# Patient Record
Sex: Male | Born: 1970 | Race: White | Hispanic: No | Marital: Single | Smoking: Current every day smoker
Health system: Southern US, Community
[De-identification: ages and names within clinical notes are randomized; demographics above are authoritative.]

## PROBLEM LIST (undated history)

## (undated) DIAGNOSIS — F32A Depression, unspecified: Secondary | ICD-10-CM

## (undated) DIAGNOSIS — R519 Headache, unspecified: Secondary | ICD-10-CM

## (undated) DIAGNOSIS — I350 Nonrheumatic aortic (valve) stenosis: Secondary | ICD-10-CM

## (undated) DIAGNOSIS — R51 Headache: Secondary | ICD-10-CM

## (undated) DIAGNOSIS — F329 Major depressive disorder, single episode, unspecified: Secondary | ICD-10-CM

## (undated) DIAGNOSIS — F419 Anxiety disorder, unspecified: Secondary | ICD-10-CM

## (undated) HISTORY — PX: WISDOM TOOTH EXTRACTION: SHX21

## (undated) HISTORY — PX: CARDIAC CATHETERIZATION: SHX172

---

## 2017-09-30 ENCOUNTER — Inpatient Hospital Stay (HOSPITAL_COMMUNITY): Payer: Self-pay

## 2017-09-30 ENCOUNTER — Observation Stay (HOSPITAL_COMMUNITY): Payer: Self-pay

## 2017-09-30 ENCOUNTER — Inpatient Hospital Stay (HOSPITAL_COMMUNITY)
Admission: EM | Admit: 2017-09-30 | Discharge: 2017-10-03 | DRG: 917 | Disposition: A | Discharge status: Home / Self Care | Payer: Self-pay | Attending: Internal Medicine | Admitting: Internal Medicine

## 2017-09-30 ENCOUNTER — Emergency Department (HOSPITAL_COMMUNITY): Payer: Self-pay

## 2017-09-30 ENCOUNTER — Other Ambulatory Visit: Payer: Self-pay

## 2017-09-30 ENCOUNTER — Encounter (HOSPITAL_COMMUNITY): Payer: Self-pay | Admitting: Emergency Medicine

## 2017-09-30 DIAGNOSIS — T402X1A Poisoning by other opioids, accidental (unintentional), initial encounter: Principal | ICD-10-CM | POA: Diagnosis present

## 2017-09-30 DIAGNOSIS — I248 Other forms of acute ischemic heart disease: Secondary | ICD-10-CM

## 2017-09-30 DIAGNOSIS — T50901A Poisoning by unspecified drugs, medicaments and biological substances, accidental (unintentional), initial encounter: Secondary | ICD-10-CM | POA: Diagnosis present

## 2017-09-30 DIAGNOSIS — K029 Dental caries, unspecified: Secondary | ICD-10-CM

## 2017-09-30 DIAGNOSIS — R4020 Unspecified coma: Secondary | ICD-10-CM | POA: Diagnosis present

## 2017-09-30 DIAGNOSIS — E876 Hypokalemia: Secondary | ICD-10-CM | POA: Diagnosis present

## 2017-09-30 DIAGNOSIS — D72829 Elevated white blood cell count, unspecified: Secondary | ICD-10-CM

## 2017-09-30 DIAGNOSIS — R778 Other specified abnormalities of plasma proteins: Secondary | ICD-10-CM

## 2017-09-30 DIAGNOSIS — J449 Chronic obstructive pulmonary disease, unspecified: Secondary | ICD-10-CM | POA: Diagnosis present

## 2017-09-30 DIAGNOSIS — J9691 Respiratory failure, unspecified with hypoxia: Secondary | ICD-10-CM | POA: Diagnosis present

## 2017-09-30 DIAGNOSIS — F17210 Nicotine dependence, cigarettes, uncomplicated: Secondary | ICD-10-CM | POA: Diagnosis present

## 2017-09-30 DIAGNOSIS — I350 Nonrheumatic aortic (valve) stenosis: Secondary | ICD-10-CM

## 2017-09-30 DIAGNOSIS — G92 Toxic encephalopathy: Secondary | ICD-10-CM | POA: Diagnosis present

## 2017-09-30 DIAGNOSIS — E860 Dehydration: Secondary | ICD-10-CM | POA: Diagnosis present

## 2017-09-30 DIAGNOSIS — T407X1A Poisoning by cannabis (derivatives), accidental (unintentional), initial encounter: Secondary | ICD-10-CM | POA: Diagnosis present

## 2017-09-30 DIAGNOSIS — N179 Acute kidney failure, unspecified: Secondary | ICD-10-CM

## 2017-09-30 DIAGNOSIS — I214 Non-ST elevation (NSTEMI) myocardial infarction: Secondary | ICD-10-CM

## 2017-09-30 DIAGNOSIS — I361 Nonrheumatic tricuspid (valve) insufficiency: Secondary | ICD-10-CM

## 2017-09-30 DIAGNOSIS — I21A1 Myocardial infarction type 2: Secondary | ICD-10-CM | POA: Diagnosis present

## 2017-09-30 DIAGNOSIS — I959 Hypotension, unspecified: Secondary | ICD-10-CM

## 2017-09-30 DIAGNOSIS — R739 Hyperglycemia, unspecified: Secondary | ICD-10-CM | POA: Diagnosis present

## 2017-09-30 DIAGNOSIS — R7989 Other specified abnormal findings of blood chemistry: Secondary | ICD-10-CM

## 2017-09-30 DIAGNOSIS — T40601A Poisoning by unspecified narcotics, accidental (unintentional), initial encounter: Secondary | ICD-10-CM

## 2017-09-30 DIAGNOSIS — Z8249 Family history of ischemic heart disease and other diseases of the circulatory system: Secondary | ICD-10-CM

## 2017-09-30 LAB — CBC WITH DIFFERENTIAL/PLATELET
BASOS ABS: 0 10*3/uL (ref 0.0–0.1)
Basophils Relative: 0 %
EOS ABS: 0.3 10*3/uL (ref 0.0–0.7)
Eosinophils Relative: 1 %
HCT: 46.7 % (ref 39.0–52.0)
HEMOGLOBIN: 15.6 g/dL (ref 13.0–17.0)
LYMPHS PCT: 6 %
Lymphs Abs: 1.8 10*3/uL (ref 0.7–4.0)
MCH: 31.8 pg (ref 26.0–34.0)
MCHC: 33.4 g/dL (ref 30.0–36.0)
MCV: 95.3 fL (ref 78.0–100.0)
Monocytes Absolute: 0.9 10*3/uL (ref 0.1–1.0)
Monocytes Relative: 3 %
NEUTROS ABS: 26.7 10*3/uL — AB (ref 1.7–7.7)
Neutrophils Relative %: 90 %
Platelets: 391 10*3/uL (ref 150–400)
RBC: 4.9 MIL/uL (ref 4.22–5.81)
RDW: 13.4 % (ref 11.5–15.5)
WBC: 29.7 10*3/uL — ABNORMAL HIGH (ref 4.0–10.5)

## 2017-09-30 LAB — COMPREHENSIVE METABOLIC PANEL
ALBUMIN: 3.9 g/dL (ref 3.5–5.0)
ALK PHOS: 93 U/L (ref 38–126)
ALT: 16 U/L (ref 0–44)
ANION GAP: 13 (ref 5–15)
AST: 25 U/L (ref 15–41)
BUN: 8 mg/dL (ref 6–20)
CALCIUM: 8.9 mg/dL (ref 8.9–10.3)
CO2: 23 mmol/L (ref 22–32)
Chloride: 101 mmol/L (ref 98–111)
Creatinine, Ser: 1.56 mg/dL — ABNORMAL HIGH (ref 0.61–1.24)
GFR calc Af Amer: 59 mL/min — ABNORMAL LOW (ref >60)
GFR calc non Af Amer: 51 mL/min — ABNORMAL LOW (ref >60)
Glucose, Bld: 256 mg/dL — ABNORMAL HIGH (ref 70–99)
POTASSIUM: 3.3 mmol/L — AB (ref 3.5–5.1)
SODIUM: 137 mmol/L (ref 135–145)
TOTAL PROTEIN: 6.2 g/dL — AB (ref 6.5–8.1)
Total Bilirubin: 0.5 mg/dL (ref 0.3–1.2)

## 2017-09-30 LAB — CBC
HCT: 40.9 % (ref 39.0–52.0)
Hemoglobin: 13.3 g/dL (ref 13.0–17.0)
MCH: 32 pg (ref 26.0–34.0)
MCHC: 32.5 g/dL (ref 30.0–36.0)
MCV: 98.3 fL (ref 78.0–100.0)
PLATELETS: 277 10*3/uL (ref 150–400)
RBC: 4.16 MIL/uL — ABNORMAL LOW (ref 4.22–5.81)
RDW: 13.9 % (ref 11.5–15.5)
WBC: 16.6 10*3/uL — AB (ref 4.0–10.5)

## 2017-09-30 LAB — CREATININE, SERUM
Creatinine, Ser: 1.13 mg/dL (ref 0.61–1.24)
GFR calc Af Amer: >60 mL/min (ref >60)
GFR calc non Af Amer: >60 mL/min (ref >60)

## 2017-09-30 LAB — I-STAT TROPONIN, ED: Troponin i, poc: 1.68 ng/mL (ref 0.00–0.08)

## 2017-09-30 LAB — URINALYSIS, ROUTINE W REFLEX MICROSCOPIC
Bilirubin Urine: NEGATIVE
GLUCOSE, UA: 50 mg/dL — AB
HGB URINE DIPSTICK: NEGATIVE
Ketones, ur: NEGATIVE mg/dL
LEUKOCYTES UA: NEGATIVE
NITRITE: NEGATIVE
PROTEIN: 100 mg/dL — AB
SPECIFIC GRAVITY, URINE: 1.016 (ref 1.005–1.030)
pH: 7 (ref 5.0–8.0)

## 2017-09-30 LAB — CK: Total CK: 199 U/L (ref 49–397)

## 2017-09-30 LAB — RAPID URINE DRUG SCREEN, HOSP PERFORMED
AMPHETAMINES: NOT DETECTED
BENZODIAZEPINES: NOT DETECTED
Barbiturates: NOT DETECTED
Cocaine: NOT DETECTED
Opiates: NOT DETECTED
TETRAHYDROCANNABINOL: POSITIVE — AB

## 2017-09-30 LAB — HEMOGLOBIN A1C
HEMOGLOBIN A1C: 5.4 % (ref 4.8–5.6)
MEAN PLASMA GLUCOSE: 108.28 mg/dL

## 2017-09-30 LAB — LIPID PANEL
CHOL/HDL RATIO: 3.3 ratio
CHOLESTEROL: 98 mg/dL (ref 0–200)
HDL: 30 mg/dL — ABNORMAL LOW (ref >40)
LDL CALC: 54 mg/dL (ref 0–99)
Triglycerides: 68 mg/dL (ref <150)
VLDL: 14 mg/dL (ref 0–40)

## 2017-09-30 LAB — SODIUM, URINE, RANDOM: Sodium, Ur: 66 mmol/L

## 2017-09-30 LAB — CREATININE, URINE, RANDOM: Creatinine, Urine: 16.65 mg/dL

## 2017-09-30 LAB — PROCALCITONIN: Procalcitonin: 23.12 ng/mL

## 2017-09-30 LAB — TROPONIN I
Troponin I: 1.56 ng/mL (ref <0.03)
Troponin I: 2.96 ng/mL (ref <0.03)
Troponin I: 1.98 ng/mL (ref <0.03)
Troponin I: 2.05 ng/mL (ref <0.03)

## 2017-09-30 LAB — ECHOCARDIOGRAM COMPLETE
HEIGHTINCHES: 70 in
Weight: 2080 oz

## 2017-09-30 LAB — ETHANOL

## 2017-09-30 LAB — PHOSPHORUS: Phosphorus: 3.5 mg/dL (ref 2.5–4.6)

## 2017-09-30 LAB — I-STAT CG4 LACTIC ACID, ED: LACTIC ACID, VENOUS: 1.61 mmol/L (ref 0.5–1.9)

## 2017-09-30 LAB — MAGNESIUM: Magnesium: 1.5 mg/dL — ABNORMAL LOW (ref 1.7–2.4)

## 2017-09-30 LAB — PROTEIN, URINE, RANDOM: Total Protein, Urine: <6 mg/dL

## 2017-09-30 MED ORDER — VANCOMYCIN HCL 500 MG IV SOLR
500.0000 mg | Freq: Two times a day (BID) | INTRAVENOUS | Status: DC
  Filled 2017-09-30 (×4): qty 500

## 2017-09-30 MED ORDER — THIAMINE HCL 100 MG/ML IJ SOLN
100.0000 mg | Freq: Every day | INTRAMUSCULAR | Status: DC
  Administered 2017-10-01: 100 mg via INTRAVENOUS
  Filled 2017-09-30 (×2): qty 2

## 2017-09-30 MED ORDER — SODIUM CHLORIDE 0.9 % IV BOLUS (SEPSIS)
2000.0000 mL | Freq: Once | INTRAVENOUS | Status: AC
  Administered 2017-09-30: 2000 mL via INTRAVENOUS

## 2017-09-30 MED ORDER — LORAZEPAM 2 MG/ML IJ SOLN: 0.0000 mg | Freq: Two times a day (BID) | INTRAMUSCULAR | Status: DC

## 2017-09-30 MED ORDER — NICOTINE 7 MG/24HR TD PT24
7.0000 mg | MEDICATED_PATCH | Freq: Every day | TRANSDERMAL | Status: DC
  Filled 2017-09-30: qty 1

## 2017-09-30 MED ORDER — LORAZEPAM 2 MG/ML IJ SOLN: 0.0000 mg | Freq: Four times a day (QID) | INTRAMUSCULAR | Status: AC

## 2017-09-30 MED ORDER — LORAZEPAM 2 MG/ML IJ SOLN: 1.0000 mg | Freq: Four times a day (QID) | INTRAMUSCULAR | Status: AC | PRN

## 2017-09-30 MED ORDER — SODIUM CHLORIDE 0.9 % IV BOLUS (SEPSIS)
1000.0000 mL | Freq: Once | INTRAVENOUS | Status: AC
  Administered 2017-09-30: 1000 mL via INTRAVENOUS

## 2017-09-30 MED ORDER — SODIUM CHLORIDE 0.9 % IV SOLN
INTRAVENOUS | Status: DC
  Administered 2017-09-30 – 2017-10-01 (×3): via INTRAVENOUS

## 2017-09-30 MED ORDER — VANCOMYCIN HCL 500 MG IV SOLR
500.0000 mg | Freq: Two times a day (BID) | INTRAVENOUS | Status: DC
  Filled 2017-09-30 (×2): qty 500

## 2017-09-30 MED ORDER — VANCOMYCIN HCL IN DEXTROSE 1-5 GM/200ML-% IV SOLN
1000.0000 mg | Freq: Once | INTRAVENOUS | Status: AC
  Administered 2017-09-30: 1000 mg via INTRAVENOUS
  Filled 2017-09-30: qty 200

## 2017-09-30 MED ORDER — SODIUM CHLORIDE 0.9 % IV SOLN
1.0000 g | Freq: Three times a day (TID) | INTRAVENOUS | Status: DC
  Administered 2017-09-30 – 2017-10-01 (×2): 1 g via INTRAVENOUS
  Filled 2017-09-30 (×3): qty 1

## 2017-09-30 MED ORDER — ADULT MULTIVITAMIN W/MINERALS CH
1.0000 | ORAL_TABLET | Freq: Every day | ORAL | Status: DC
  Administered 2017-09-30 – 2017-10-03 (×4): 1 via ORAL
  Filled 2017-09-30 (×4): qty 1

## 2017-09-30 MED ORDER — SODIUM CHLORIDE 0.9 % IV SOLN
1.0000 g | INTRAVENOUS | Status: DC
  Filled 2017-09-30: qty 1

## 2017-09-30 MED ORDER — METRONIDAZOLE IN NACL 5-0.79 MG/ML-% IV SOLN
500.0000 mg | Freq: Three times a day (TID) | INTRAVENOUS | Status: DC
  Administered 2017-09-30 – 2017-10-01 (×4): 500 mg via INTRAVENOUS
  Filled 2017-09-30 (×5): qty 100

## 2017-09-30 MED ORDER — SODIUM CHLORIDE 0.9 % IV SOLN
2.0000 g | Freq: Once | INTRAVENOUS | Status: AC
  Administered 2017-09-30: 2 g via INTRAVENOUS
  Filled 2017-09-30: qty 2

## 2017-09-30 MED ORDER — ASPIRIN EC 81 MG PO TBEC
81.0000 mg | DELAYED_RELEASE_TABLET | Freq: Every day | ORAL | Status: DC
  Administered 2017-09-30 – 2017-10-01 (×2): 81 mg via ORAL
  Filled 2017-09-30 (×3): qty 1

## 2017-09-30 MED ORDER — INFLUENZA VAC SPLIT QUAD 0.5 ML IM SUSY
0.5000 mL | PREFILLED_SYRINGE | INTRAMUSCULAR | Status: DC
  Filled 2017-09-30: qty 0.5

## 2017-09-30 MED ORDER — LORAZEPAM 1 MG PO TABS: 1.0000 mg | ORAL_TABLET | Freq: Four times a day (QID) | ORAL | Status: AC | PRN

## 2017-09-30 MED ORDER — FOLIC ACID 1 MG PO TABS
1.0000 mg | ORAL_TABLET | Freq: Every day | ORAL | Status: DC
  Administered 2017-09-30 – 2017-10-03 (×4): 1 mg via ORAL
  Filled 2017-09-30 (×4): qty 1

## 2017-09-30 MED ORDER — HEPARIN SODIUM (PORCINE) 5000 UNIT/ML IJ SOLN
5000.0000 [IU] | Freq: Three times a day (TID) | INTRAMUSCULAR | Status: DC
  Administered 2017-09-30 – 2017-10-02 (×5): 5000 [IU] via SUBCUTANEOUS
  Filled 2017-09-30 (×4): qty 1

## 2017-09-30 MED ORDER — NICOTINE 21 MG/24HR TD PT24
21.0000 mg | MEDICATED_PATCH | Freq: Every day | TRANSDERMAL | Status: DC
  Administered 2017-09-30 – 2017-10-03 (×4): 21 mg via TRANSDERMAL
  Filled 2017-09-30 (×4): qty 1

## 2017-09-30 MED ORDER — VANCOMYCIN HCL IN DEXTROSE 750-5 MG/150ML-% IV SOLN
750.0000 mg | Freq: Two times a day (BID) | INTRAVENOUS | Status: DC
  Administered 2017-09-30: 750 mg via INTRAVENOUS
  Filled 2017-09-30 (×2): qty 150

## 2017-09-30 MED ORDER — VITAMIN B-1 100 MG PO TABS
100.0000 mg | ORAL_TABLET | Freq: Every day | ORAL | Status: DC
  Administered 2017-09-30 – 2017-10-03 (×4): 100 mg via ORAL
  Filled 2017-09-30 (×4): qty 1

## 2017-09-30 NOTE — ED Provider Notes (Signed)
TIME SEEN: 12:28 AM  CHIEF COMPLAINT: Accidental overdose  HPI: Patient is a 47 year old male with history of substance abuse who presents to the emergency department as an accidental overdose with EMS.  Patient reports that he snorted 3 Percocet tablets that he buys off the street, smoked weed.  Was unresponsive with agonal respirations.  Given Narcan with EMS.  Patient now awake, alert, oriented.  He denies that this was an attempt to hurt himself.  Denies any fever, chest pain, shortness of breath, vomiting, diarrhea, bloody stool, melena.  ROS: See HPI Constitutional: no fever  Eyes: no drainage  ENT: no runny nose   Cardiovascular:  no chest pain  Resp: no SOB  GI: no vomiting GU: no dysuria Integumentary: no rash  Allergy: no hives  Musculoskeletal: no leg swelling  Neurological: no slurred speech ROS otherwise negative  PAST MEDICAL HISTORY/PAST SURGICAL HISTORY:  No past medical history on file.  MEDICATIONS:  Prior to Admission medications   Not on File    ALLERGIES:  Allergies not on file  SOCIAL HISTORY:  Social History   Tobacco Use  . Smoking status: Not on file  Substance Use Topics  . Alcohol use: Not on file    FAMILY HISTORY: No family history on file.  EXAM: BP 92/73 (BP Location: Right Arm)   Pulse (!) 144   Temp (!) 97.3 F (36.3 C)   Resp 16   Ht 5\' 10"  (1.778 m)   Wt 59 kg   SpO2 94%   BMI 18.65 kg/m  CONSTITUTIONAL: Alert and oriented and responds appropriately to questions.  Chronically ill-appearing, thin HEAD: Normocephalic EYES: Conjunctivae clear, pupils appear equal, EOMI ENT: normal nose; moist mucous membranes NECK: Supple, no meningismus, no nuchal rigidity, no LAD  CARD: Regular and tachycardic; S1 and S2 appreciated; no murmurs, no clicks, no rubs, no gallops RESP: Normal chest excursion without splinting or tachypnea; breath sounds clear and equal bilaterally; no wheezes, no rhonchi, no rales, no hypoxia or respiratory  distress, speaking full sentences ABD/GI: Normal bowel sounds; non-distended; soft, non-tender, no rebound, no guarding, no peritoneal signs, no hepatosplenomegaly BACK:  The back appears normal and is non-tender to palpation, there is no CVA tenderness EXT: Normal ROM in all joints; non-tender to palpation; no edema; normal capillary refill; no cyanosis, no calf tenderness or swelling    SKIN: Normal color for age and race; warm; no rash NEURO: Moves all extremities equally PSYCH: The patient's mood and manner are appropriate. Grooming and personal hygiene are appropriate.  Denies SI.  MEDICAL DECISION MAKING: Patient here after accidental overdose.  He is tachycardic and hypotensive.  No infectious symptoms.  No vomiting or diarrhea.  No bloody stools or melena.  Will hydrate patient, check labs, urine.  He reports this was not intentional.  He will be monitored closely in the ED.  He is comfortable with this plan.  EKG shows sinus tachycardia with diffuse ST depression likely rate related.  He denies chest pain or shortness of breath.  ED PROGRESS: Patient has elevated leukocytosis but I think this is likely reactive.  No infectious symptoms or signs of infection on examination.  Urine direction positive for THC.  Alcohol level is negative.  Heart rate and blood pressure slowly improving with IV hydration.  Will give third liter of IV fluids and continue to monitor.  2:50 AM  Pt remains tachycardic and hypotensive despite 3 L of IV fluids.  I am now more concern for possible sepsis.  Will obtain lactate, blood cultures, chest x-ray.  Will give broad-spectrum antibiotics.  I have recommended admission but this time patient would like to go home.  We discussed risks of leaving AGAINST MEDICAL ADVICE.  At this time he states he will "think about it".  He is okay with further hydration and IV antibiotics.  3:50 AM  Pt's lactate is negative but his troponin is significantly elevated at 1.68.  He still  denies any recent chest pain or shortness of breath.  No history of known cardiac disease but he does not see primary care physician regularly and is a smoker.  Discussed with cardiologist Dr. Franz Dell and agrees that EKG changes are likely rate related from demand ischemia.  He will see patient in the ED.  At this time patient is still apprehensive about being admitted to the hospital.  His mother is at bedside and has also tried to attempt to get him to agree to be admitted.  We have discussed concerns of unstable vital signs and elevated troponin and that this could lead to life-threatening illness, death if untreated.   4:35 AM  Pt's repeat EKG shows that ischemic changes have resolved since his rate has improved.  Patient seen by cardiology who thinks elevated troponin likely in the setting of demand ischemia with underlying CAD in the setting of overdose.  Recommends lipid panel, hemoglobin A1c, echocardiogram and admission to medicine.  Patient now amenable to admission.   4:41 AM Discussed patient's case with hospitalist, Dr. Antionette Char.  I have recommended admission and patient (and family if present) agree with this plan. Admitting physician will place admission orders.   I reviewed all nursing notes, vitals, pertinent previous records, EKGs, lab and urine results, imaging (as available).     EKG Interpretation  Date/Time:  Tuesday September 30 2017 00:07:56 EDT Ventricular Rate:  145 PR Interval:    QRS Duration: 89 QT Interval:  296 QTC Calculation: 460 R Axis:   64 Text Interpretation:  Sinus tachycardia RSR' in V1 or V2, probably normal variant Probable LVH with secondary repol abnrm ST depression, consider ischemia, diffuse lds likely rate related No old tracing to compare Confirmed by Ward, Baxter Hire [phone]) on 09/30/2017 12:28:30 AM         EKG Interpretation  Date/Time:  Tuesday September 30 2017 03:55:08 EDT Ventricular Rate:  105 PR Interval:    QRS Duration: 91 QT  Interval:  339 QTC Calculation: 448 R Axis:   57 Text Interpretation:  Sinus tachycardia RSR' in V1 or V2, probably normal variant ST elev, probable normal early repol pattern Confirmed by Rochele Raring [phone]) on 09/30/2017 3:57:52 AM        CRITICAL CARE Performed by: Baxter Hire Ward   Total critical care time: 65 minutes  Critical care time was exclusive of separately billable procedures and treating other patients.  Critical care was necessary to treat or prevent imminent or life-threatening deterioration.  Critical care was time spent personally by me on the following activities: development of treatment plan with patient and/or surrogate as well as nursing, discussions with consultants, evaluation of patient's response to treatment, examination of patient, obtaining history from patient or surrogate, ordering and performing treatments and interventions, ordering and review of laboratory studies, ordering and review of radiographic studies, pulse oximetry and re-evaluation of patient's condition.      Ward, Layla Maw, DO 09/30/17 [phone]

## 2017-09-30 NOTE — Consult Note (Signed)
Cardiology Consultation:   Patient ID: Austin Francis; [id number]; 12/22/1970   Admit date: 09/30/2017 Date of Consult: 09/30/2017  Primary Care Provider: Patient, No Pcp Per Primary Cardiologist: No primary care provider on file. Primary Electrophysiologist:  None  Chief Complaint: Obtundation  Patient Profile:   Austin Francis is a 47 y.o. male with chronic obstructive lung disease, migraine headaches and longstanding smoking who is being seen today for the evaluation of elevated troponin at the request of Dr. Elesa Massed.   History of Present Illness:  Austin Francis is a 47 y.o. male with chronic obstructive lung disease, migraine headaches and longstanding smoking who is being seen today for the evaluation of elevated troponin at the request of Dr. Elesa Massed.   He was with his step cousin talking about potential jobs. He then took four Percocet (snorted two and ate two), smoked a marijuana cigarette. He was subsequently founded obtunded by emergency medical services. He received narcan with resolution of his obtundation.  He has had a cough for the past 3-4 days. This has been productive at times of green tinged sputum.   He has one prior episode of loss of consciousness in the context of alprazolam use.   He denies any chest pain.   Medical History - Migraine headaches - Polysubstance abuse - COPD  Past Surgical History:  Procedure Laterality Date  . WISDOM TOOTH EXTRACTION       Inpatient Medications: Scheduled Meds:  Continuous Infusions: . [START ON 10/01/2017] ceFEPime (MAXIPIME) IV    . metronidazole 500 mg (09/30/17 0315)  . vancomycin    . vancomycin 1,000 mg (09/30/17 0355)   PRN Meds:   Home Meds: Prior to Admission medications   Not on File    Allergies:   No Known Allergies  Social History:   He has a 40 to 50 pack-year smoking history. He does not drink alcohol. Currently unemployed.   Family History:   The patient reports no history of early onset  coronary artery disease or sudden cardiac death.   ROS:  Please see the history of present illness.   Review of Systems  Constitutional: Negative for chills, diaphoresis and fever.  HENT: Negative for hearing loss and tinnitus.   Eyes: Negative for blurred vision, double vision and photophobia.  Respiratory: Positive for cough, sputum production and shortness of breath.   Cardiovascular: Negative for chest pain, palpitations, orthopnea, claudication and leg swelling.  Gastrointestinal: Negative for abdominal pain, blood in stool, constipation, diarrhea, melena, nausea and vomiting.  Genitourinary: Negative for dysuria, frequency and urgency.  Musculoskeletal: Positive for joint pain (Left knee). Negative for back pain, myalgias and neck pain.  Skin: Negative for rash.       Multiple tattoos   Neurological: Positive for loss of consciousness and headaches. Negative for dizziness, tremors, focal weakness, seizures and weakness.  Psychiatric/Behavioral: Positive for substance abuse. Negative for suicidal ideas.     Physical Exam/Data:   Vitals:   09/30/17 0245 09/30/17 0315 09/30/17 0330 09/30/17 0345  BP: 96/79 (!) 86/69 (!) 82/70 (!) 80/62  Pulse: (!) 117 (!) 104 (!) 115 (!) 101  Resp: 15 13 20 14   Temp:      SpO2: 99% 97% 96% 97%  Weight:      Height:        Intake/Output Summary (Last 24 hours) at 09/30/2017 0359 Last data filed at 09/30/2017 1308 Gross per 24 hour  Intake 3000 ml  Output -  Net 3000 ml   [employer]  09/30/17 0023  Weight: 59 kg   Body mass index is 18.65 kg/m.  General: Well developed, well nourished, in no acute distress. Head: Normocephalic, atraumatic, sclera non-icteric, no xanthomas, nares are without discharge.  Neck: Negative for carotid bruits. JVD not elevated. Lungs:Focal wheezes in right greater than left lower posterior lung fields. Breathing is unlabored. Heart: Grade 3/6 systolic murmur RUSB. No murmurs, rubs, or gallops  appreciated. Abdomen: Soft, non-tender, non-distended with normoactive bowel sounds. No hepatomegaly. No rebound/guarding. No obvious abdominal masses. Msk:  Strength and tone appear normal for age. Extremities: No clubbing or cyanosis. No edema.  Distal pedal pulses are 2+ and equal bilaterally. Neuro: Alert and oriented X 3. No facial asymmetry. No focal deficit. Moves all extremities spontaneously. Psych:  Responds to questions appropriately with a normal affect.  EKG:  The EKG was personally reviewed and demonstrates sinus tachycardia with diffuse ST depression. Repeat EKG when no longer tachycardic demonstrates resolution of his ST segment changes.   Relevant CV Studies: No prior cardiovascular testing.   Laboratory Data:  Chemistry Recent Labs  Lab 09/30/17 0035  NA 137  K 3.3*  CL 101  CO2 23  GLUCOSE 256*  BUN 8  CREATININE 1.56*  CALCIUM 8.9  GFRNONAA 51*  GFRAA 59*  ANIONGAP 13    Recent Labs  Lab 09/30/17 0035  PROT 6.2*  ALBUMIN 3.9  AST 25  ALT 16  ALKPHOS 93  BILITOT 0.5   Hematology Recent Labs  Lab 09/30/17 0035  WBC 29.7*  RBC 4.90  HGB 15.6  HCT 46.7  MCV 95.3  MCH 31.8  MCHC 33.4  RDW 13.4  PLT 391   Cardiac EnzymesNo results for input(s): TROPONINI in the last 168 hours.  Recent Labs  Lab 09/30/17 0309  TROPIPOC 1.68*    BNPNo results for input(s): BNP, PROBNP in the last 168 hours.  DDimer No results for input(s): DDIMER in the last 168 hours.  Radiology/Studies:  Dg Chest Port 1 View  Result Date: 09/30/2017 CLINICAL DATA:  Code sepsis EXAM: PORTABLE CHEST 1 VIEW COMPARISON:  Portable exam 0253 hours without priors for comparison FINDINGS: Normal heart size, mediastinal contours, and pulmonary vascularity. Lungs emphysematous with minimal subsegmental atelectasis at LEFT base. Remaining lungs clear. No pleural effusion or pneumothorax. Diffuse osseous demineralization. IMPRESSION: Emphysematous changes with subsegmental  atelectasis at LEFT base. Electronically Signed   By: Ulyses Southward M.D.   On: 09/30/2017 03:02    Assessment and Plan:   Austin Francis is a 47 y.o. male with chronic obstructive lung disease, migraine headaches and longstanding smoking who is being seen today for the evaluation of elevated troponin at the request of Dr. Elesa Massed.   1. Non-ST segment myocardial infarction. He presents with a type 2 demand-type NSTEMI in the setting of respiratory failure due opioid overdose. Initially he had ST depressions on his EKG while tachycardic now these have resolved. He has no chest pain or other symptoms concerning for a type 1 acute plaque rupture event. He has long-term smoking history but otherwise his ASCVD risk factors are incompletely evaluated at this time. His examination is notable for signs of wheezing and an easily audible systolic murmur. - Aspirin 81 mg po daily  - Obtain HgbA1c, lipid panel - Obtain transthoracic echocardiogram - Nicotine patch / smoking cessation - No heparin / ACS therapy  We will continue to follow along with you.    For questions or updates, please contact [facility] Please consult [email or web address] for contact info  under Cardiology/STEMI.    Signed, Laverda Page, MD  09/30/2017 3:59 AM

## 2017-09-30 NOTE — H&P (Signed)
History and Physical  Austin Francis WUJ:[id number] DOB: December 04, 1970 DOA: 09/30/2017  Referring physician: ER physician PCP: Patient, No Pcp Per  Outpatient Specialists:    Patient coming from: Home  Chief Complaint: Unconsciousness following use of illegally obtained opiates.  HPI:  Patient is a 47 year old Caucasian male, with history of tobacco use, likely undiagnosed COPD and in the 60s of pain medication.  According to the patient, he started by smoking cannabis with his step cousin, and went ahead to swallow 1 pill of opiate and snorted 2 tablets of opiate.  Following use of these substances, patient became unconscious and was transferred to the hospital.  Patient responded to Narcan.  Work-up done revealed elevated troponin (1.56), but patient has no chest pain.  UDS was positive for tetrahydrocannabinol, but negative for opiates.  Chest x-ray revealed emphysematous changes, subsegmental atelectasis at the left base.  WBC is noted to be 29.7, 90% neutrophils, absolute neutrophil count of 26.7, potassium of 3.3, BUN of 8 and serum creatinine of 1.56 (baseline serum creatinine is unknown), blood sugar of 256 with A1c of 5.4 (patient is not a known diabetic).  EKG reveals sinus tachycardia, with RSR' in V1 and V2 with likely early repolarization.  No headache, no neck pain, no fever or chills, no URI symptoms, no chest pain, no shortness of breath, no GI symptoms and no urinary symptoms.  Patient is back to baseline mental status.  ED Course: Narcan was administered.  Unconsciousness has resolved.  Work-up revealed leukocytosis, elevated troponin, likely acute kidney injury, with serum creatinine of 1.56. Pertinent labs: See above. EKG: Independently reviewed.  Imaging: independently reviewed.   Review of Systems:   Negative for fever, visual changes, sore throat, rash, new muscle aches, chest pain, SOB, dysuria, bleeding, n/v/abdominal pain.  History reviewed. No pertinent past medical  history.  Past Surgical History:  Procedure Laterality Date  . WISDOM TOOTH EXTRACTION       reports that he has been smoking cigarettes. He has been smoking about 1.00 pack per day. He has never used smokeless tobacco. He reports that he drinks alcohol. He reports that he has current or past drug history.  No Known Allergies  No family history on file.   Prior to Admission medications   Medication Sig Start Date End Date Taking? Authorizing Provider  aspirin-acetaminophen-caffeine (EXCEDRIN MIGRAINE) [phone] MG tablet Take 1-2 tablets by mouth every 6 (six) hours as needed (for migraines).    Yes [provider]  ibuprofen (ADVIL,MOTRIN) 200 MG tablet Take 200-600 mg by mouth every 6 (six) hours as needed (for migraines).    Yes [provider]    Physical Exam: Vitals:   09/30/17 0930 09/30/17 1000 09/30/17 1030 09/30/17 1100  BP: 91/70 108/85 100/80 104/83  Pulse: 77 85 80 76  Resp: 16 17 (!) 23 18  Temp:      SpO2: 98% 99% 98% 100%  Weight:      Height:        Constitutional:  . Appears calm and comfortable Eyes:  . No pallor. No jaundice.  ENMT:  . external ears, nose appear normal Neck:  . Neck is supple. No JVD Respiratory:  . Expiratory wheeze. Marland Kitchen Respiratory effort normal. No retractions or accessory muscle use Cardiovascular:  . S1S2 heard . No LE extremity edema   Abdomen:  . Abdomen is soft and non tender. Organs are difficult to assess. Neurologic:  . Awake and alert. . Moves all limbs.  Wt Readings from Last 3  Encounters:  09/30/17 59 kg    I have personally reviewed following labs and imaging studies  Labs on Admission:  CBC: Recent Labs  Lab 09/30/17 0035  WBC 29.7*  NEUTROABS 26.7*  HGB 15.6  HCT 46.7  MCV 95.3  PLT 391   Basic Metabolic Panel: Recent Labs  Lab 09/30/17 0035  NA 137  K 3.3*  CL 101  CO2 23  GLUCOSE 256*  BUN 8  CREATININE 1.56*  CALCIUM 8.9   Liver Function Tests: Recent Labs    Lab 09/30/17 0035  AST 25  ALT 16  ALKPHOS 93  BILITOT 0.5  PROT 6.2*  ALBUMIN 3.9   No results for input(s): LIPASE, AMYLASE in the last 168 hours. No results for input(s): AMMONIA in the last 168 hours. Coagulation Profile: No results for input(s): INR, PROTIME in the last 168 hours. Cardiac Enzymes: Recent Labs  Lab 09/30/17 0356  TROPONINI 1.56*   BNP (last 3 results) No results for input(s): PROBNP in the last 8760 hours. HbA1C: Recent Labs    09/30/17 0452  HGBA1C 5.4   CBG: No results for input(s): GLUCAP in the last 168 hours. Lipid Profile: Recent Labs    09/30/17 0453  CHOL 98  HDL 30*  LDLCALC 54  TRIG 68  CHOLHDL 3.3   Thyroid Function Tests: No results for input(s): TSH, T4TOTAL, FREET4, T3FREE, THYROIDAB in the last 72 hours. Anemia Panel: No results for input(s): VITAMINB12, FOLATE, FERRITIN, TIBC, IRON, RETICCTPCT in the last 72 hours. Urine analysis:    Component Value Date/Time   COLORURINE YELLOW 09/30/2017 0115   APPEARANCEUR HAZY (A) 09/30/2017 0115   LABSPEC 1.016 09/30/2017 0115   PHURINE 7.0 09/30/2017 0115   GLUCOSEU 50 (A) 09/30/2017 0115   HGBUR NEGATIVE 09/30/2017 0115   BILIRUBINUR NEGATIVE 09/30/2017 0115   KETONESUR NEGATIVE 09/30/2017 0115   PROTEINUR 100 (A) 09/30/2017 0115   NITRITE NEGATIVE 09/30/2017 0115   LEUKOCYTESUR NEGATIVE 09/30/2017 0115   Sepsis Labs: @LABRCNTIP (procalcitonin:4,lacticidven:4) )No results found for this or any previous visit (from the past 240 hour(s)).    Radiological Exams on Admission: Dg Chest Port 1 View  Result Date: 09/30/2017 CLINICAL DATA:  Code sepsis EXAM: PORTABLE CHEST 1 VIEW COMPARISON:  Portable exam 0253 hours without priors for comparison FINDINGS: Normal heart size, mediastinal contours, and pulmonary vascularity. Lungs emphysematous with minimal subsegmental atelectasis at LEFT base. Remaining lungs clear. No pleural effusion or pneumothorax. Diffuse osseous  demineralization. IMPRESSION: Emphysematous changes with subsegmental atelectasis at LEFT base. Electronically Signed   By: Ulyses Southward M.D.   On: 09/30/2017 03:02    EKG: Independently reviewed.   Active Problems:   Accidental overdose   Unconsciousness (HCC)   Assessment/Plan Elevated troponin: Likely type II MI. Cardiology team has already been consulted. Pursue echocardiogram. We will defer management to the cardiology team.  Unconsciousness: This has followed illicitly acquired opiates. Unconsciousness has resolved with Narcan. Patient has been counseled to quit illicitly obtained substances. Consider follow-up at his Suboxone clinic.  Acute kidney injury: Will work-up. Possibly prerenal. Hydrate patient, repeat BMP.  Leukocytosis with a left shift: Cause unclear Possibly reactive Panculture patient Further management depend on hospital course.  Elevated blood sugar: Continue work-up to rule out previously undiagnosed diabetes mellitus. A1c is 5.4%.  Hypokalemia: Monitor and replete.  Likely undiagnosed COPD/tobacco use: Counseled to quit tobacco use Nebulizer DuoNeb and Pulmicort.   Further work-up on discharge by the primary care provider.   DVT prophylaxis: Subcu heparin Code Status:  Full code Family Communication: Patient's Aunt Disposition Plan: Home eventually Consults called: Cardiology has already been consulted Admission status: Inpatient  Time spent: 65 minutes.   Berton Mount, MD  [facility] Pager #: [phone] 7PM-7AM contact night coverage as above   09/30/2017, 11:29 AM

## 2017-09-30 NOTE — Progress Notes (Signed)
  Echocardiogram 2D Echocardiogram has been performed.  Anarie Kalish G Allen Basista 09/30/2017, 12:17 PM

## 2017-09-30 NOTE — Progress Notes (Signed)
Pharmacy Antibiotic Note  Austin Francis is a 47 y.o. male admitted on 09/30/2017 with sepsis.  Pharmacy has been consulted for cefepime and vancomycin dosing.  SCr has improved from admission with estimated CrCl ~66 mL/min. Cultures remain pending.   Plan: Increase vancomycin 750 mg IV q12 hours Increase cefepime 1gm IV q8 hours F/u renal function, cultures and clinical course  Height: 5\' 10"  (177.8 cm) Weight: 127 lb 6.8 oz (57.8 kg) IBW/kg (Calculated) : 73  Temp (24hrs), Avg:97.7 F (36.5 C), Min:97.3 F (36.3 C), Max:98 F (36.7 C)  Recent Labs  Lab 09/30/17 0035 09/30/17 0343 09/30/17 1221 09/30/17 1639  WBC 29.7*  --   --  16.6*  CREATININE 1.56*  --  1.13  --   LATICACIDVEN  --  1.61  --   --     Estimated Creatinine Clearance: 66.1 mL/min (by C-G formula based on SCr of 1.13 mg/dL).    No Known Allergies  Thank you for allowing pharmacy to be a part of this patient's care.  Fayne Norrie 09/30/2017 7:55 PM

## 2017-09-30 NOTE — ED Triage Notes (Signed)
Pt arrives from [facility], pt was found unresponsive. Pt given 4 mg narcan in 2 separate doses before becoming alert and oriented. Pt reports taking 3 percocets, reports "I put money in a box and I get them" when asked where he gets his pills from. Tachycardic at 154 upon arrival to ED.

## 2017-09-30 NOTE — Progress Notes (Signed)
Progress Note  Patient Name: Austin Francis Date of Encounter: 09/30/2017  Primary Cardiologist: New  Subjective   A&Ox3. No symptoms at present. He denies CP and dyspnea.   Inpatient Medications    Scheduled Meds: . folic acid  1 mg Oral Daily  . LORazepam  0-4 mg Intravenous Q6H   Followed by  . [START ON 10/02/2017] LORazepam  0-4 mg Intravenous Q12H  . multivitamin with minerals  1 tablet Oral Daily  . nicotine  21 mg Transdermal Daily  . thiamine  100 mg Oral Daily   Or  . thiamine  100 mg Intravenous Daily   Continuous Infusions: . [START ON 10/01/2017] ceFEPime (MAXIPIME) IV    . metronidazole Stopped (09/30/17 0432)  . vancomycin     PRN Meds: LORazepam **OR** LORazepam   Vital Signs    Vitals:   09/30/17 0530 09/30/17 0545 09/30/17 0700 09/30/17 0745  BP: (!) 82/67 (!) 81/65 (!) 85/65 96/78  Pulse: 86 86 83 82  Resp: 17 15 13 14   Temp:      SpO2: 97% 97% 97% 98%  Weight:      Height:        Intake/Output Summary (Last 24 hours) at 09/30/2017 0847 Last data filed at 09/30/2017 0608 Gross per 24 hour  Intake 4300 ml  Output 300 ml  Net 4000 ml   Filed Weights   09/30/17 0023  Weight: 59 kg    Telemetry    Sinus tach with max rates in the 150s upon arrival, currently NSR in the 80s - Personally Reviewed  ECG    Sinus tach 102 bpm - Personally Reviewed  Physical Exam   GEN: No acute distress.   Neck: No JVD Cardiac: RRR, no murmurs, rubs, or gallops.  Respiratory: inspiratory and expiratory rhonchi, no crackles GI: Soft, nontender, non-distended  MS: No edema; No deformity. Neuro:  Nonfocal  Psych: Normal affect   Labs    Chemistry Recent Labs  Lab 09/30/17 0035  NA 137  K 3.3*  CL 101  CO2 23  GLUCOSE 256*  BUN 8  CREATININE 1.56*  CALCIUM 8.9  PROT 6.2*  ALBUMIN 3.9  AST 25  ALT 16  ALKPHOS 93  BILITOT 0.5  GFRNONAA 51*  GFRAA 59*  ANIONGAP 13     Hematology Recent Labs  Lab 09/30/17 0035  WBC 29.7*  RBC  4.90  HGB 15.6  HCT 46.7  MCV 95.3  MCH 31.8  MCHC 33.4  RDW 13.4  PLT 391    Cardiac Enzymes Recent Labs  Lab 09/30/17 0356  TROPONINI 1.56*    Recent Labs  Lab 09/30/17 0309  TROPIPOC 1.68*     BNPNo results for input(s): BNP, PROBNP in the last 168 hours.   DDimer No results for input(s): DDIMER in the last 168 hours.   Radiology    Dg Chest Port 1 View  Result Date: 09/30/2017 CLINICAL DATA:  Code sepsis EXAM: PORTABLE CHEST 1 VIEW COMPARISON:  Portable exam 0253 hours without priors for comparison FINDINGS: Normal heart size, mediastinal contours, and pulmonary vascularity. Lungs emphysematous with minimal subsegmental atelectasis at LEFT base. Remaining lungs clear. No pleural effusion or pneumothorax. Diffuse osseous demineralization. IMPRESSION: Emphysematous changes with subsegmental atelectasis at LEFT base. Electronically Signed   By: Ulyses Southward M.D.   On: 09/30/2017 03:02    Cardiac Studies   2D echo pending.   Patient Profile     [name] is a 47 y.o. male with chronic  obstructive lung disease, migraine headaches and longstanding smoking history who is being seen today for the evaluation of elevated troponin, in the setting of accidental drug overdose, reversed with narcan.   Assessment & Plan    1. Elevated Troponin: Initial POC troponin at 0300 was 1.68. Actual lab troponin 1.56. Repeat troponin's pending to assess trend. He is w/o CP and no dyspnea. Initial EKG showed sinus tach, now NSR on tele. Only known cardiac risk factor is tobacco use. He has known family h/o heart disease/MI in distant relatives but no first degree relatives, although he admits that he does no know his father well. Labs here show controlled lipids with LDL in the 50s. HDL is low however. Hgb A1c 5.4 ruling out DM. No HTN. Physical exam is unremarkable other than inspiratory/expiratory wheezing. Agree with earlier assessment that enzyme elevation is likely  type 2 demand-type   NSTEMI in the setting of respiratory failure from opioid overdose. Continue to cycle enzymes to ensure downward trend and check 2D echo to assess LVEF.   For questions or updates, please contact [facility] Please consult [email or web address] for contact info under        Signed, Robbie Lis, PA-C  09/30/2017, 8:47 AM

## 2017-09-30 NOTE — ED Notes (Signed)
Ordered lunch meal tray for pt

## 2017-09-30 NOTE — ED Notes (Signed)
Heart healthy breakfast tray ordered 

## 2017-09-30 NOTE — ED Notes (Addendum)
CRITICAL VALUE ALERT  Critical Value:  Troponin 2.96  Date & Time Notied:  09/30/2017  Provider Notified: Boyce Medici, PA  Orders Received/Actions taken: No further orders at this time. Pt denies CP

## 2017-09-30 NOTE — ED Notes (Signed)
Disposed two percocet tablets in sharps box that pt handed Dr. Elesa Massed. Dr. Elesa Massed witnessed the disposal.

## 2017-09-30 NOTE — ED Notes (Signed)
Two separate orders for nicotine patches at different doses, discontinued step three dosage after verifying with pharmacy

## 2017-09-30 NOTE — ED Notes (Signed)
Date and time results received: 09/30/17 5:48 AM (use smartphrase ".now" to insert current time)  Test: Troponin Critical Value: 1.56  Name of Provider Notified: Dr. Antionette Char Orders Received? Or Actions Taken?: continue to monitor, existing order for troponin redraw q3 hours

## 2017-09-30 NOTE — Progress Notes (Signed)
Pharmacy Antibiotic Note  Austin Francis is a 47 y.o. male admitted on 09/30/2017 with sepsis.  Pharmacy has been consulted for cefepime and vancomycin dosing.Vancomycin 1gm and cefepime 2gm ordered in the ED  Plan: Continue vancomycin 500 mg IV q12 hours Continue cefepime 1gm IV q24 hours F/u renal function, cultures and clinical course  Height: 5\' 10"  (177.8 cm) Weight: 130 lb (59 kg) IBW/kg (Calculated) : 73  Temp (24hrs), Avg:97.3 F (36.3 C), Min:97.3 F (36.3 C), Max:97.3 F (36.3 C)  Recent Labs  Lab 09/30/17 0035  WBC 29.7*  CREATININE 1.56*    Estimated Creatinine Clearance: 48.9 mL/min (A) (by C-G formula based on SCr of 1.56 mg/dL (H)).    No Known Allergies  Thank you for allowing pharmacy to be a part of this patient's care.  Talbert Cage Poteet 09/30/2017 2:54 AM

## 2017-10-01 DIAGNOSIS — I350 Nonrheumatic aortic (valve) stenosis: Secondary | ICD-10-CM

## 2017-10-01 DIAGNOSIS — I214 Non-ST elevation (NSTEMI) myocardial infarction: Secondary | ICD-10-CM | POA: Diagnosis present

## 2017-10-01 DIAGNOSIS — R4020 Unspecified coma: Secondary | ICD-10-CM

## 2017-10-01 DIAGNOSIS — T50901A Poisoning by unspecified drugs, medicaments and biological substances, accidental (unintentional), initial encounter: Secondary | ICD-10-CM

## 2017-10-01 LAB — FANA STAINING PATTERNS
Homogeneous Pattern: 1:160 {titer} — ABNORMAL HIGH
Speckled Pattern: 1:80 {titer}

## 2017-10-01 LAB — BASIC METABOLIC PANEL
Anion gap: 9 (ref 5–15)
BUN: 5 mg/dL — ABNORMAL LOW (ref 6–20)
CO2: 23 mmol/L (ref 22–32)
Calcium: 8.6 mg/dL — ABNORMAL LOW (ref 8.9–10.3)
Chloride: 107 mmol/L (ref 98–111)
Creatinine, Ser: 0.94 mg/dL (ref 0.61–1.24)
GFR calc Af Amer: >60 mL/min (ref >60)
GFR calc non Af Amer: >60 mL/min (ref >60)
Glucose, Bld: 117 mg/dL — ABNORMAL HIGH (ref 70–99)
Potassium: 3.1 mmol/L — ABNORMAL LOW (ref 3.5–5.1)
Sodium: 139 mmol/L (ref 135–145)

## 2017-10-01 LAB — CBC
HCT: 36.2 % — ABNORMAL LOW (ref 39.0–52.0)
Hemoglobin: 12.3 g/dL — ABNORMAL LOW (ref 13.0–17.0)
MCH: 32.1 pg (ref 26.0–34.0)
MCHC: 34 g/dL (ref 30.0–36.0)
MCV: 94.5 fL (ref 78.0–100.0)
Platelets: 262 10*3/uL (ref 150–400)
RBC: 3.83 MIL/uL — ABNORMAL LOW (ref 4.22–5.81)
RDW: 13.6 % (ref 11.5–15.5)
WBC: 15.9 10*3/uL — ABNORMAL HIGH (ref 4.0–10.5)

## 2017-10-01 LAB — ANTINUCLEAR ANTIBODIES, IFA: ANA Ab, IFA: POSITIVE — AB

## 2017-10-01 LAB — HIV ANTIBODY (ROUTINE TESTING W REFLEX): HIV Screen 4th Generation wRfx: NONREACTIVE

## 2017-10-01 MED ORDER — BUDESONIDE 0.25 MG/2ML IN SUSP
0.2500 mg | Freq: Two times a day (BID) | RESPIRATORY_TRACT | Status: DC
  Administered 2017-10-01 – 2017-10-03 (×5): 0.25 mg via RESPIRATORY_TRACT
  Filled 2017-10-01 (×5): qty 2

## 2017-10-01 MED ORDER — IPRATROPIUM-ALBUTEROL 0.5-2.5 (3) MG/3ML IN SOLN: 3.0000 mL | Freq: Four times a day (QID) | RESPIRATORY_TRACT | Status: DC | PRN

## 2017-10-01 MED ORDER — ACETAMINOPHEN 325 MG PO TABS
650.0000 mg | ORAL_TABLET | Freq: Four times a day (QID) | ORAL | Status: DC | PRN
  Administered 2017-10-01 – 2017-10-02 (×2): 650 mg via ORAL
  Filled 2017-10-01 (×2): qty 2

## 2017-10-01 MED ORDER — SODIUM CHLORIDE 0.9% FLUSH: 3.0000 mL | INTRAVENOUS | Status: DC | PRN

## 2017-10-01 MED ORDER — POTASSIUM CHLORIDE CRYS ER 20 MEQ PO TBCR
40.0000 meq | EXTENDED_RELEASE_TABLET | Freq: Once | ORAL | Status: AC
  Administered 2017-10-01: 40 meq via ORAL
  Filled 2017-10-01: qty 2

## 2017-10-01 MED ORDER — IPRATROPIUM-ALBUTEROL 0.5-2.5 (3) MG/3ML IN SOLN
3.0000 mL | Freq: Four times a day (QID) | RESPIRATORY_TRACT | Status: DC
  Administered 2017-10-01: 3 mL via RESPIRATORY_TRACT
  Filled 2017-10-01: qty 3

## 2017-10-01 MED ORDER — SODIUM CHLORIDE 0.9 % WEIGHT BASED INFUSION
1.0000 mL/kg/h | INTRAVENOUS | Status: DC
  Administered 2017-10-02 (×2): 1 mL/kg/h via INTRAVENOUS

## 2017-10-01 MED ORDER — ASPIRIN 81 MG PO CHEW
81.0000 mg | CHEWABLE_TABLET | ORAL | Status: AC
  Administered 2017-10-02: 81 mg via ORAL
  Filled 2017-10-01: qty 1

## 2017-10-01 MED ORDER — ASPIRIN EC 81 MG PO TBEC
81.0000 mg | DELAYED_RELEASE_TABLET | Freq: Every day | ORAL | Status: DC
  Administered 2017-10-03: 81 mg via ORAL
  Filled 2017-10-01: qty 1

## 2017-10-01 MED ORDER — SODIUM CHLORIDE 0.9 % WEIGHT BASED INFUSION: 3.0000 mL/kg/h | INTRAVENOUS | Status: DC

## 2017-10-01 MED ORDER — SODIUM CHLORIDE 0.9% FLUSH
3.0000 mL | Freq: Two times a day (BID) | INTRAVENOUS | Status: DC
  Administered 2017-10-01 – 2017-10-02 (×2): 3 mL via INTRAVENOUS

## 2017-10-01 MED ORDER — SODIUM CHLORIDE 0.9 % IV SOLN: 250.0000 mL | INTRAVENOUS | Status: DC | PRN

## 2017-10-01 NOTE — Progress Notes (Signed)
  Received notification from Dr. Allena Katz that pt now agrees to [facility]. Will place on schedule for 10/02/17. Precath Orders placed.   Robbie Lis, PA-C

## 2017-10-01 NOTE — Progress Notes (Signed)
DAILY PROGRESS NOTE   Patient Name: Austin Francis Date of Encounter: 10/01/2017  Chief Complaint   No chest pain  Patient Profile   47 yo male with opiod overdose and associated hypoxic respiratory arrest, revived with narcan and found to have elevated troponin.  Subjective   No issues overnight. Echo shows LVEF 55-60%, however, there is severe thickening and possible bicuspid morphology of the valve with severe stenosis and mild to moderate regurgitation.  Objective   Vitals:   09/30/17 1616 09/30/17 2316 10/01/17 0817 10/01/17 0846  BP: (!) 133/93 120/87 111/82   Pulse: 91 94 98   Resp:  16 18   Temp:  98.9 F (37.2 C) 99.6 F (37.6 C)   TempSrc:  Oral Oral   SpO2: 97% 96% 95% 97%  Weight:      Height:        Intake/Output Summary (Last 24 hours) at 10/01/2017 1610 Last data filed at 10/01/2017 0700 Gross per 24 hour  Intake 2460.33 ml  Output 1300 ml  Net 1160.33 ml   Filed Weights   09/30/17 0023 09/30/17 1612  Weight: 59 kg 57.8 kg    Physical Exam   General appearance: alert, no distress and mildly irritable Neck: no carotid bruit, no JVD and thyroid not enlarged, symmetric, no tenderness/mass/nodules Lungs: clear to auscultation bilaterally Heart: regular rate and rhythm and systolic murmur: systolic ejection 3/6, crescendo at 2nd right intercostal space Abdomen: soft, non-tender; bowel sounds normal; no masses,  no organomegaly Extremities: extremities normal, atraumatic, no cyanosis or edema Pulses: 2+ and symmetric Skin: Skin color, texture, turgor normal. No rashes or lesions Neurologic: Grossly normal Psych: Upset  Inpatient Medications    Scheduled Meds: . aspirin EC  81 mg Oral Daily  . budesonide (PULMICORT) nebulizer solution  0.25 mg Nebulization BID  . folic acid  1 mg Oral Daily  . heparin  5,000 Units Subcutaneous Q8H  . Influenza vac split quadrivalent PF  0.5 mL Intramuscular Tomorrow-1000  . LORazepam  0-4 mg Intravenous Q6H     Followed by  . [START ON 10/02/2017] LORazepam  0-4 mg Intravenous Q12H  . multivitamin with minerals  1 tablet Oral Daily  . nicotine  21 mg Transdermal Daily  . potassium chloride  40 mEq Oral Once  . thiamine  100 mg Oral Daily   Or  . thiamine  100 mg Intravenous Daily    Continuous Infusions: . sodium chloride 100 mL/hr at 10/01/17 0116    PRN Meds: ipratropium-albuterol, LORazepam **OR** LORazepam   Labs   Results for orders placed or performed during the hospital encounter of 09/30/17 (from the past 48 hour(s))  CBC with Differential     Status: Abnormal   Collection Time: 09/30/17 12:35 AM  Result Value Ref Range   WBC 29.7 (H) 4.0 - 10.5 K/uL   RBC 4.90 4.22 - 5.81 MIL/uL   Hemoglobin 15.6 13.0 - 17.0 g/dL   HCT 46.7 39.0 - 52.0 %   MCV 95.3 78.0 - 100.0 fL   MCH 31.8 26.0 - 34.0 pg   MCHC 33.4 30.0 - 36.0 g/dL   RDW 13.4 11.5 - 15.5 %   Platelets 391 150 - 400 K/uL   Neutrophils Relative % 90 %   Lymphocytes Relative 6 %   Monocytes Relative 3 %   Eosinophils Relative 1 %   Basophils Relative 0 %   Neutro Abs 26.7 (H) 1.7 - 7.7 K/uL   Lymphs Abs 1.8 0.7 - 4.0  K/uL   Monocytes Absolute 0.9 0.1 - 1.0 K/uL   Eosinophils Absolute 0.3 0.0 - 0.7 K/uL   Basophils Absolute 0.0 0.0 - 0.1 K/uL   Smear Review MORPHOLOGY UNREMARKABLE     Comment: Performed at [facility] [address], [place], [place] [address]  Comprehensive metabolic panel     Status: Abnormal   Collection Time: 09/30/17 12:35 AM  Result Value Ref Range   Sodium 137 135 - 145 mmol/L   Potassium 3.3 (L) 3.5 - 5.1 mmol/L   Chloride 101 98 - 111 mmol/L   CO2 23 22 - 32 mmol/L   Glucose, Bld 256 (H) 70 - 99 mg/dL   BUN 8 6 - 20 mg/dL   Creatinine, Ser 1.56 (H) 0.61 - 1.24 mg/dL   Calcium 8.9 8.9 - 10.3 mg/dL   Total Protein 6.2 (L) 6.5 - 8.1 g/dL   Albumin 3.9 3.5 - 5.0 g/dL   AST 25 15 - 41 U/L   ALT 16 0 - 44 U/L   Alkaline Phosphatase 93 38 - 126 U/L   Total Bilirubin 0.5 0.3 -  1.2 mg/dL   GFR calc non Af Amer 51 (L) >60 mL/min   GFR calc Af Amer 59 (L) >60 mL/min    Comment: (NOTE) The eGFR has been calculated using the CKD EPI equation. This calculation has not been validated in all clinical situations. eGFR's persistently <60 mL/min signify possible Chronic Kidney Disease.    Anion gap 13 5 - 15    Comment: Performed at [facility] [address], [place], [place] [address]  Ethanol     Status: None   Collection Time: 09/30/17 12:35 AM  Result Value Ref Range   Alcohol, Ethyl (B) <10 <10 mg/dL    Comment: (NOTE) Lowest detectable limit for serum alcohol is 10 mg/dL. For medical purposes only. Performed at [facility], [place] [address] [place], [place] [address]   Urinalysis, Routine w reflex microscopic     Status: Abnormal   Collection Time: 09/30/17  1:15 AM  Result Value Ref Range   Color, Urine YELLOW YELLOW   APPearance HAZY (A) CLEAR   Specific Gravity, Urine 1.016 1.005 - 1.030   pH 7.0 5.0 - 8.0   Glucose, UA 50 (A) NEGATIVE mg/dL   Hgb urine dipstick NEGATIVE NEGATIVE   Bilirubin Urine NEGATIVE NEGATIVE   Ketones, ur NEGATIVE NEGATIVE mg/dL   Protein, ur 100 (A) NEGATIVE mg/dL   Nitrite NEGATIVE NEGATIVE   Leukocytes, UA NEGATIVE NEGATIVE   RBC / HPF 6-10 0 - 5 RBC/hpf   WBC, UA 6-10 0 - 5 WBC/hpf   Bacteria, UA RARE (A) NONE SEEN   Mucus PRESENT    Hyaline Casts, UA PRESENT    Amorphous Crystal PRESENT     Comment: Performed at [facility] [address], [place], [place] [address]  Rapid urine drug screen (hospital performed)     Status: Abnormal   Collection Time: 09/30/17  1:15 AM  Result Value Ref Range   Opiates NONE DETECTED NONE DETECTED   Cocaine NONE DETECTED NONE DETECTED   Benzodiazepines NONE DETECTED NONE DETECTED   Amphetamines NONE DETECTED NONE DETECTED   Tetrahydrocannabinol POSITIVE (A) NONE DETECTED   Barbiturates NONE DETECTED NONE DETECTED    Comment: (NOTE) DRUG SCREEN FOR MEDICAL  PURPOSES ONLY.  IF CONFIRMATION IS NEEDED FOR ANY PURPOSE, NOTIFY LAB WITHIN 5 DAYS. LOWEST DETECTABLE LIMITS FOR URINE DRUG SCREEN Drug  Class                     Cutoff (ng/mL) Amphetamine and metabolites    1000 Barbiturate and metabolites    200 Benzodiazepine                 762 Tricyclics and metabolites     300 Opiates and metabolites        300 Cocaine and metabolites        300 THC                            50 Performed at [facility], [place] [address] [place], [place] [address]   Blood Culture (routine x 2)     Status: None (Preliminary result)   Collection Time: 09/30/17  2:50 AM  Result Value Ref Range   Specimen Description BLOOD LEFT ARM    Special Requests      BOTTLES DRAWN AEROBIC AND ANAEROBIC Blood Culture adequate volume   Culture      NO GROWTH 1 DAY Performed at [facility], [place] [address], [place], [place] [address]    Report Status PENDING   Blood Culture (routine x 2)     Status: None (Preliminary result)   Collection Time: 09/30/17  3:04 AM  Result Value Ref Range   Specimen Description BLOOD RIGHT WRIST    Special Requests      BOTTLES DRAWN AEROBIC AND ANAEROBIC Blood Culture adequate volume   Culture      NO GROWTH 1 DAY Performed at [facility], [place] [address] [place], [place] [address]    Report Status PENDING   I-stat troponin, ED     Status: Abnormal   Collection Time: 09/30/17  3:09 AM  Result Value Ref Range   Troponin i, poc 1.68 (HH) 0.00 - 0.08 ng/mL   Comment NOTIFIED PHYSICIAN    Comment 3            Comment: Due to the release kinetics of cTnI, a negative result within the first hours of the onset of symptoms does not rule out myocardial infarction with certainty. If myocardial infarction is still suspected, repeat the test at appropriate intervals.   I-Stat CG4 Lactic Acid, ED  (not at  [facility])     Status: None   Collection Time: 09/30/17  3:43 AM  Result Value Ref Range   Lactic Acid, Venous 1.61 0.5  - 1.9 mmol/L  Troponin I     Status: Abnormal   Collection Time: 09/30/17  3:56 AM  Result Value Ref Range   Troponin I 1.56 (HH) <0.03 ng/mL    Comment: CRITICAL RESULT CALLED TO, READ BACK BY AND VERIFIED WITH: Suan Halter [id number] WILDERK Performed at [facility], [place] [address], [place], [place] [address]   Hemoglobin A1c     Status: None   Collection Time: 09/30/17  4:52 AM  Result Value Ref Range   Hgb A1c MFr Bld 5.4 4.8 - 5.6 %    Comment: (NOTE) Pre diabetes:          5.7%-6.4% Diabetes:              >6.4% Glycemic control for   <7.0% adults with diabetes    Mean Plasma Glucose 108.28 mg/dL    Comment: Performed at [facility] [address], [place], [place] [address]  Lipid panel  Status: Abnormal   Collection Time: 09/30/17  4:53 AM  Result Value Ref Range   Cholesterol 98 0 - 200 mg/dL   Triglycerides 68 <150 mg/dL   HDL 30 (L) >40 mg/dL   Total CHOL/HDL Ratio 3.3 RATIO   VLDL 14 0 - 40 mg/dL   LDL Cholesterol 54 0 - 99 mg/dL    Comment:        Total Cholesterol/HDL:CHD Risk Coronary Heart Disease Risk Table                     Men   Women  1/2 Average Risk   3.4   3.3  Average Risk       5.0   4.4  2 X Average Risk   9.6   7.1  3 X Average Risk  23.4   11.0        Use the calculated Patient Ratio above and the CHD Risk Table to determine the patient's CHD Risk.        ATP III CLASSIFICATION (LDL):  <100     mg/dL   Optimal  100-129  mg/dL   Near or Above                    Optimal  130-159  mg/dL   Borderline  160-189  mg/dL   High  >190     mg/dL   Very High Performed at [facility] [address], [place], [place] [address]   Troponin I (q 6hr x 3)     Status: Abnormal   Collection Time: 09/30/17 10:35 AM  Result Value Ref Range   Troponin I 2.96 (HH) <0.03 ng/mL    Comment: CRITICAL RESULT CALLED TO, READ BACK BY AND VERIFIED WITH: [name],RN AT 1148 09/30/17 BY ZBEECH. Performed at [facility], [place] [address] [place], [place] [address]   HIV antibody (Routine Testing)     Status: None   Collection Time: 09/30/17 12:21 PM  Result Value Ref Range   HIV Screen 4th Generation wRfx Non Reactive Non Reactive    Comment: (NOTE) Performed At: [facility] [place], [place] [id number] Rush Farmer MD [id number]   Creatinine, serum     Status: None   Collection Time: 09/30/17 12:21 PM  Result Value Ref Range   Creatinine, Ser 1.13 0.61 - 1.24 mg/dL   GFR calc non Af Amer >60 >60 mL/min   GFR calc Af Amer >60 >60 mL/min    Comment: (NOTE) The eGFR has been calculated using the CKD EPI equation. This calculation has not been validated in all clinical situations. eGFR's persistently <60 mL/min signify possible Chronic Kidney Disease. Performed at [facility], [place] [address] [place], [place] [address]   Magnesium     Status: Abnormal   Collection Time: 09/30/17 12:21 PM  Result Value Ref Range   Magnesium 1.5 (L) 1.7 - 2.4 mg/dL    Comment: Performed at [facility] [address] [place], [place] [address]  Phosphorus     Status: None   Collection Time: 09/30/17 12:21 PM  Result Value Ref Range   Phosphorus 3.5 2.5 - 4.6 mg/dL    Comment: Performed at [facility] [address] [place], [place] [address]  CK     Status: None   Collection Time: 09/30/17 12:21 PM  Result Value Ref Range   Total CK 199 49 - 397  U/L    Comment: Performed at [facility], [place] [address], [place], [place] [address]  Procalcitonin - Baseline     Status: None   Collection Time: 09/30/17 12:21 PM  Result Value Ref Range   Procalcitonin 23.12 ng/mL    Comment:        Interpretation: PCT >= 10 ng/mL: Important systemic inflammatory response, almost exclusively due to severe bacterial sepsis or septic shock. (NOTE)       Sepsis PCT Algorithm           Lower Respiratory Tract                                      Infection PCT Algorithm     ----------------------------     ----------------------------         PCT < 0.25 ng/mL                PCT < 0.10 ng/mL         Strongly encourage             Strongly discourage   discontinuation of antibiotics    initiation of antibiotics    ----------------------------     -----------------------------       PCT 0.25 - 0.50 ng/mL            PCT 0.10 - 0.25 ng/mL               OR       >80% decrease in PCT            Discourage initiation of                                            antibiotics      Encourage discontinuation           of antibiotics    ----------------------------     -----------------------------         PCT >= 0.50 ng/mL              PCT 0.26 - 0.50 ng/mL                AND       <80% decrease in PCT             Encourage initiation of                                             antibiotics       Encourage continuation           of antibiotics    ----------------------------     -----------------------------        PCT >= 0.50 ng/mL                  PCT > 0.50 ng/mL               AND         increase in PCT                  Strongly encourage  initiation of antibiotics    Strongly encourage escalation           of antibiotics                                     -----------------------------                                           PCT <= 0.25 ng/mL                                                 OR                                        > 80% decrease in PCT                                     Discontinue / Do not initiate                                             antibiotics Performed at [facility], [address] [place], [place] [address]   Sodium, urine, random     Status: None   Collection Time: 09/30/17 12:50 PM  Result Value Ref Range   Sodium, Ur 66 mmol/L    Comment: Performed at [facility] [address], [place], [place] [address]  Protein, urine, random     Status: None   Collection  Time: 09/30/17 12:50 PM  Result Value Ref Range   Total Protein, Urine <6.0 mg/dL    Comment: REPEATED TO VERIFY Performed at [facility] [address] [place], [place] [address]   Creatinine, urine, random     Status: None   Collection Time: 09/30/17 12:50 PM  Result Value Ref Range   Creatinine, Urine 16.65 mg/dL    Comment: Performed at [facility], [place] [address], [place], [place] [address]  Troponin I     Status: Abnormal   Collection Time: 09/30/17  4:39 PM  Result Value Ref Range   Troponin I 1.98 (HH) <0.03 ng/mL    Comment: CRITICAL VALUE NOTED.  VALUE IS CONSISTENT WITH PREVIOUSLY REPORTED AND CALLED VALUE. Performed at [facility], [place] [address], [place], [place] [address]   CBC     Status: Abnormal   Collection Time: 09/30/17  4:39 PM  Result Value Ref Range   WBC 16.6 (H) 4.0 - 10.5 K/uL   RBC 4.16 (L) 4.22 - 5.81 MIL/uL   Hemoglobin 13.3 13.0 - 17.0 g/dL   HCT 40.9 39.0 - 52.0 %   MCV 98.3 78.0 - 100.0 fL   MCH 32.0 26.0 - 34.0 pg   MCHC 32.5 30.0 - 36.0 g/dL   RDW 13.9 11.5 - 15.5 %   Platelets 277 150 - 400 K/uL    Comment: Performed at  [facility], [place] [address] [place], [place] [address]  Troponin I (q 6hr x 3)     Status: Abnormal   Collection Time: 09/30/17  7:18 PM  Result Value Ref Range   Troponin I 2.05 (HH) <0.03 ng/mL    Comment: CRITICAL VALUE NOTED.  VALUE IS CONSISTENT WITH PREVIOUSLY REPORTED AND CALLED VALUE. Performed at [facility], [place] [address], [place], [place] [address]   Basic metabolic panel     Status: Abnormal   Collection Time: 10/01/17  2:59 AM  Result Value Ref Range   Sodium 139 135 - 145 mmol/L   Potassium 3.1 (L) 3.5 - 5.1 mmol/L   Chloride 107 98 - 111 mmol/L   CO2 23 22 - 32 mmol/L   Glucose, Bld 117 (H) 70 - 99 mg/dL   BUN 5 (L) 6 - 20 mg/dL   Creatinine, Ser 0.94 0.61 - 1.24 mg/dL   Calcium 8.6 (L) 8.9 - 10.3 mg/dL   GFR calc non Af Amer >60 >60 mL/min   GFR calc Af Amer >60  >60 mL/min    Comment: (NOTE) The eGFR has been calculated using the CKD EPI equation. This calculation has not been validated in all clinical situations. eGFR's persistently <60 mL/min signify possible Chronic Kidney Disease.    Anion gap 9 5 - 15    Comment: Performed at [facility] [address] [place], [place] [address]  CBC     Status: Abnormal   Collection Time: 10/01/17  2:59 AM  Result Value Ref Range   WBC 15.9 (H) 4.0 - 10.5 K/uL   RBC 3.83 (L) 4.22 - 5.81 MIL/uL   Hemoglobin 12.3 (L) 13.0 - 17.0 g/dL   HCT 36.2 (L) 39.0 - 52.0 %   MCV 94.5 78.0 - 100.0 fL   MCH 32.1 26.0 - 34.0 pg   MCHC 34.0 30.0 - 36.0 g/dL   RDW 13.6 11.5 - 15.5 %   Platelets 262 150 - 400 K/uL    Comment: Performed at [facility], [place] [address] [place], [place] [address]    ECG   N/A  Telemetry   Sinus rhythm - Personally Reviewed  Radiology    US Renal  Result Date: 09/30/2017 CLINICAL DATA:  47 year old male with acute kidney insufficiency. Initial encounter. EXAM: RENAL / URINARY TRACT ULTRASOUND COMPLETE COMPARISON:  None. FINDINGS: Right Kidney: Length: 11.6 cm. Echogenicity within normal limits. No mass or hydronephrosis visualized. Left Kidney: Length: 11.4 cm. Echogenicity within normal limits. No mass or hydronephrosis visualized. Bladder: Appears normal for degree of bladder distention. Accessory splenic tissue suspected. Small bilateral pleural effusions. IMPRESSION: 1. No hydronephrosis. 2. Small bilateral pleural effusions. Electronically Signed   By: Genia Del M.D.   On: 09/30/2017 12:24   Dg Chest Port 1 View  Result Date: 09/30/2017 CLINICAL DATA:  Code sepsis EXAM: PORTABLE CHEST 1 VIEW COMPARISON:  Portable exam 0253 hours without priors for comparison FINDINGS: Normal heart size, mediastinal contours, and pulmonary vascularity. Lungs emphysematous with minimal subsegmental atelectasis at LEFT base. Remaining lungs clear. No pleural effusion or  pneumothorax. Diffuse osseous demineralization. IMPRESSION: Emphysematous changes with subsegmental atelectasis at LEFT base. Electronically Signed   By: Lavonia Dana M.D.   On: 09/30/2017 03:02    Cardiac Studies   LV EF: 55% -   60%  ------------------------------------------------------------------- History:   PMH:  Elevated Troponin.  Atrial fibrillation.  Risk factors:  Hypertension. Diabetes mellitus. Dyslipidemia.  ------------------------------------------------------------------- Study  Conclusions  - Left ventricle: The cavity size was normal. Systolic function was   normal. The estimated ejection fraction was in the range of 55%   to 60%. Wall motion was normal; there were no regional wall   motion abnormalities. Left ventricular diastolic function   parameters were normal. - Aortic valve: Possibly bicuspid; moderately thickened, mildly   calcified leaflets. Severe focal calcification involving the   posterior cusp. There was severe stenosis. There was mild to   moderate regurgitation directed centrally in the LVOT. Valve area   (VTI): 1.02 cm^2. Valve area (Vmax): 1.01 cm^2. Valve area   (Vmean): 1 cm^2. - Tricuspid valve: There was mild-moderate regurgitation. - Pulmonary arteries: Systolic pressure was mildly increased. PA   peak pressure: 45 mm Hg (S).  Assessment   Principal Problem:   Accidental overdose Active Problems:   Unconsciousness (HCC)   Non-ST elevation (NSTEMI) myocardial infarction [facility])   Aortic stenosis   Plan   1. Echo shows severe AS - suspect bicuspid morphology. Elevated troponin likely due to a combination of this, hypoxia and possibly underlying CAD. LHC and work-up for AVR is recommended. I discussed this at length with the patient today and he is very hesitant to proceed. Worried about "dying on the table". He wants to go home for a few days and "think about it". His mother is on his way to the hospital. Please keep NPO and I'm happy to  speak with them more about it later this morning.   Time Spent Directly with Patient:  I have spent a total of 25 minutes with the patient reviewing hospital notes, telemetry, EKGs, labs and examining the patient as well as establishing an assessment and plan that was discussed personally with the patient.  > 50% of time was spent in direct patient care.  Length of Stay:  LOS: 1 day   Pixie Casino, MD, [facility], [place] Director of the Advanced Lipid Disorders &  Cardiovascular Risk Reduction Clinic Diplomate of the American Board of Clinical Lipidology Attending Cardiologist  Direct Dial: [phone]  Fax: [phone]  Website:  [email or web address] [place] 10/01/2017, 9:09 AM

## 2017-10-01 NOTE — Progress Notes (Signed)
[facility] Progress Note  Patient: Austin Francis PQA:[id number]   PCP: Patient, No Pcp Per DOB: 21-Apr-1970   DOA: 09/30/2017   DOS: 10/01/2017   Date of Service: the patient was seen and examined on 10/01/2017  Subjective: Patient denies any acute complaint.  No dizziness no lightheadedness.  No chest pain no abdominal pain.  Initially was wanting to go home but now agreeable to stay in the hospital for the procedure.  Brief hospital course: Pt. with PMH of active smoker, undiagnosed COPD; admitted on 09/30/2017, presented with complaint of unresponsive episode, was found to have drug overdose.  Also had elevated troponin work-up which showed severe aortic stenosis now needing further work-up. Currently further plan is continue further work-up for cardiac status.  Assessment and Plan: 1.  Accidental drug overdose. Unresponsive episode. Presented with unresponsive episode, EMS was called. EMS gave patient Narcan twice which made the patient alert awake and oriented x3. Patient legally obtains Percocet and snorted 3 Percocet tablet and smoked weed before coming to the hospital. Currently alert awake and oriented x3 denies any acute complaint.  No suicidal ideation.  Continue to monitor in the hospital.  2.  Elevated troponin. Severe aortic stenosis. Cardiology was consulted. EKG showed dynamic changes. Troponins were elevated. Echocardiogram shows preserved EF but shows severe aortic stenosis. Cardiology was consulted appreciate input. At present the patient does not require any IV anticoagulation but he definitely requires left heart cardiac catheterization. Patient was initially refusing to consider cardiac catheterization and wanting to leave AMA. He wanted to leave AMA yesterday. After extensive discussion with patient patient's mother as well as patient's uncle patient decided to stay in the hospital and would like to proceed with further work-up regarding left heart cath as well  as severe aortic stenosis. N.p.o. after midnight.  3.  Acute kidney injury. Likely from dehydration. After aggressive hydration renal function is almost normalized. We will continue IV fluids for now given that the patient is going to go for cardiac catheterization.  4.  Leukocytosis. Likely reactive. Pancultured. Discontinue antibiotics. Monitor.  5.  Active smoker. Counseled to quit tobacco. Patient wants to use nicotine patch on discharge. Patient also probably has undiagnosed COPD. Continue nebulizers. Will require PFT prior to considering any surgery.  Diet: Cardiac diet, n.p.o. after midnight DVT Prophylaxis: subcutaneous Heparin  Advance goals of care discussion: full code  Family Communication: family was present at bedside, at the time of interview. The pt provided permission to discuss medical plan with the family. Opportunity was given to ask question and all questions were answered satisfactorily.   Disposition:  Discharge to home.  Consultants: cardiology  Procedures: Echocardiogram   Antibiotics: Anti-infectives (From admission, onward)   Start     Dose/Rate Route Frequency Ordered Stop   10/01/17 0300  ceFEPIme (MAXIPIME) 1 g in sodium chloride 0.9 % 100 mL IVPB  Status:  Discontinued     1 g 200 mL/hr over 30 Minutes Intravenous Every 24 hours 09/30/17 0300 09/30/17 1956   09/30/17 2200  ceFEPIme (MAXIPIME) 1 g in sodium chloride 0.9 % 100 mL IVPB  Status:  Discontinued     1 g 200 mL/hr over 30 Minutes Intravenous Every 8 hours 09/30/17 1956 10/01/17 0753   09/30/17 2000  vancomycin (VANCOCIN) 500 mg in sodium chloride 0.9 % 100 mL IVPB  Status:  Discontinued     500 mg 100 mL/hr over 60 Minutes Intravenous Every 12 hours 09/30/17 1950 09/30/17 1956   09/30/17 2000  vancomycin (VANCOCIN)  IVPB 750 mg/150 ml premix  Status:  Discontinued     750 mg 150 mL/hr over 60 Minutes Intravenous Every 12 hours 09/30/17 1956 10/01/17 0753   09/30/17 1600   vancomycin (VANCOCIN) 500 mg in sodium chloride 0.9 % 100 mL IVPB  Status:  Discontinued     500 mg 100 mL/hr over 60 Minutes Intravenous Every 12 hours 09/30/17 0300 09/30/17 1950   09/30/17 0300  ceFEPIme (MAXIPIME) 2 g in sodium chloride 0.9 % 100 mL IVPB     2 g 200 mL/hr over 30 Minutes Intravenous  Once 09/30/17 0249 09/30/17 0345   09/30/17 0300  metroNIDAZOLE (FLAGYL) IVPB 500 mg  Status:  Discontinued     500 mg 100 mL/hr over 60 Minutes Intravenous Every 8 hours 09/30/17 0249 10/01/17 0753   09/30/17 0300  vancomycin (VANCOCIN) IVPB 1000 mg/200 mL premix     1,000 mg 200 mL/hr over 60 Minutes Intravenous  Once 09/30/17 0249 09/30/17 0608       Objective: Physical Exam: Vitals:   09/30/17 1616 09/30/17 2316 10/01/17 0817 10/01/17 0846  BP: (!) 133/93 120/87 111/82   Pulse: 91 94 98   Resp:  16 18   Temp:  98.9 F (37.2 C) 99.6 F (37.6 C)   TempSrc:  Oral Oral   SpO2: 97% 96% 95% 97%  Weight:      Height:        Intake/Output Summary (Last 24 hours) at 10/01/2017 1752 Last data filed at 10/01/2017 1043 Gross per 24 hour  Intake 2155.33 ml  Output 1300 ml  Net 855.33 ml   Filed Weights   09/30/17 0023 09/30/17 1612  Weight: 59 kg 57.8 kg   General: Alert, Awake and Oriented to Time, Place and Person. Appear in mild distress, affect appropriate Eyes: PERRL, Conjunctiva normal ENT: Oral Mucosa clear moist. Neck: no JVD, no Abnormal Mass Or lumps Cardiovascular: S1 and S2 Present, no Murmur, Peripheral Pulses Present Respiratory: normal respiratory effort, Bilateral Air entry equal and Decreased, no use of accessory muscle, Clear to Auscultation, no Crackles, no wheezes Abdomen: Bowel Sound present, Soft and no tenderness, no hernia Skin: on redness, no Rash, no induration Extremities: no Pedal edema, no calf tenderness Neurologic: Grossly no focal neuro deficit. Bilaterally Equal motor strength  Data Reviewed: CBC: Recent Labs  Lab 09/30/17 0035  09/30/17 1639 10/01/17 0259  WBC 29.7* 16.6* 15.9*  NEUTROABS 26.7*  --   --   HGB 15.6 13.3 12.3*  HCT 46.7 40.9 36.2*  MCV 95.3 98.3 94.5  PLT 391 277 262   Basic Metabolic Panel: Recent Labs  Lab 09/30/17 0035 09/30/17 1221 10/01/17 0259  NA 137  --  139  K 3.3*  --  3.1*  CL 101  --  107  CO2 23  --  23  GLUCOSE 256*  --  117*  BUN 8  --  5*  CREATININE 1.56* 1.13 0.94  CALCIUM 8.9  --  8.6*  MG  --  1.5*  --   PHOS  --  3.5  --     Liver Function Tests: Recent Labs  Lab 09/30/17 0035  AST 25  ALT 16  ALKPHOS 93  BILITOT 0.5  PROT 6.2*  ALBUMIN 3.9   No results for input(s): LIPASE, AMYLASE in the last 168 hours. No results for input(s): AMMONIA in the last 168 hours. Coagulation Profile: No results for input(s): INR, PROTIME in the last 168 hours. Cardiac Enzymes: Recent Labs  Lab  09/30/17 0356 09/30/17 1035 09/30/17 1221 09/30/17 1639 09/30/17 1918  CKTOTAL  --   --  199  --   --   TROPONINI 1.56* 2.96*  --  1.98* 2.05*   BNP (last 3 results) No results for input(s): PROBNP in the last 8760 hours. CBG: No results for input(s): GLUCAP in the last 168 hours. Studies: No results found.  Scheduled Meds: . aspirin EC  81 mg Oral Daily  . budesonide (PULMICORT) nebulizer solution  0.25 mg Nebulization BID  . folic acid  1 mg Oral Daily  . heparin  5,000 Units Subcutaneous Q8H  . Influenza vac split quadrivalent PF  0.5 mL Intramuscular Tomorrow-1000  . LORazepam  0-4 mg Intravenous Q6H   Followed by  . [START ON 10/02/2017] LORazepam  0-4 mg Intravenous Q12H  . multivitamin with minerals  1 tablet Oral Daily  . nicotine  21 mg Transdermal Daily  . thiamine  100 mg Oral Daily   Or  . thiamine  100 mg Intravenous Daily   Continuous Infusions: . sodium chloride 100 mL/hr at 10/01/17 1451   PRN Meds: ipratropium-albuterol, LORazepam **OR** LORazepam  Time spent: Discussed with patient between 10-10 30, mother present in the room.  Repeat  evaluation at 3:30- 3:45, and 5-5:15. This time does not include 20 minutes spent with RN case manager, discussion with cardiology and working upon plan of care for the patient  Author: Lynden Oxford, MD [facility] Pager: [phone] 10/01/2017 5:52 PM  If 7PM-7AM, please contact night-coverage at [email or web address], password [email or web address]

## 2017-10-01 NOTE — Care Management Note (Signed)
Case Management Note  Patient Details  Name: Austin Francis MRN: [id number] Date of Birth: 29-Sep-1970  Subjective/Objective:   From home, presents with sepsis, accidental od, no insurance or pcp.  NCM scheduled follow up apt for patient at [facility] to see Evie Lacks NP on 9/18 at 10:20.  He can get ast with medications there as well and establish pcp.  He may need a Programmer, systems at discharge.                  Action/Plan: DC home when ready.   Expected Discharge Date:  10/01/17               Expected Discharge Plan:  Home/Self Care  In-House Referral:     Discharge planning Services  CM Consult, [facility], Follow-up appt scheduled, Medication Assistance  Post Acute Care Choice:    Choice offered to:     DME Arranged:    DME Agency:     HH Arranged:    HH Agency:     Status of Service:  In process, will continue to follow  If discussed at Long Length of Stay Meetings, dates discussed:    Additional Comments:  Leone Haven, RN 10/01/2017, 9:16 AM

## 2017-10-02 ENCOUNTER — Encounter (HOSPITAL_COMMUNITY)
Admission: EM | Disposition: A | Discharge status: Home / Self Care | Payer: Self-pay | Source: Home / Self Care | Attending: Internal Medicine

## 2017-10-02 DIAGNOSIS — R778 Other specified abnormalities of plasma proteins: Secondary | ICD-10-CM

## 2017-10-02 DIAGNOSIS — R748 Abnormal levels of other serum enzymes: Secondary | ICD-10-CM

## 2017-10-02 DIAGNOSIS — R7989 Other specified abnormal findings of blood chemistry: Secondary | ICD-10-CM

## 2017-10-02 DIAGNOSIS — N179 Acute kidney failure, unspecified: Secondary | ICD-10-CM

## 2017-10-02 HISTORY — PX: RIGHT/LEFT HEART CATH AND CORONARY ANGIOGRAPHY: CATH118266

## 2017-10-02 LAB — POCT I-STAT 3, ART BLOOD GAS (G3+)
Acid-base deficit: 1 mmol/L (ref 0.0–2.0)
Bicarbonate: 22.6 mmol/L (ref 20.0–28.0)
O2 Saturation: 95 %
TCO2: 24 mmol/L (ref 22–32)
pCO2 arterial: 34.8 mmHg (ref 32.0–48.0)
pH, Arterial: 7.421 (ref 7.350–7.450)
pO2, Arterial: 72 mmHg — ABNORMAL LOW (ref 83.0–108.0)

## 2017-10-02 LAB — CBC WITH DIFFERENTIAL/PLATELET
Abs Immature Granulocytes: 0 10*3/uL (ref 0.0–0.1)
BASOS ABS: 0.1 10*3/uL (ref 0.0–0.1)
BASOS PCT: 1 %
EOS PCT: 3 %
Eosinophils Absolute: 0.3 10*3/uL (ref 0.0–0.7)
HCT: 36.6 % — ABNORMAL LOW (ref 39.0–52.0)
Hemoglobin: 12.2 g/dL — ABNORMAL LOW (ref 13.0–17.0)
Immature Granulocytes: 0 %
Lymphocytes Relative: 24 %
Lymphs Abs: 2.1 10*3/uL (ref 0.7–4.0)
MCH: 31.5 pg (ref 26.0–34.0)
MCHC: 33.3 g/dL (ref 30.0–36.0)
MCV: 94.6 fL (ref 78.0–100.0)
MONO ABS: 0.8 10*3/uL (ref 0.1–1.0)
Monocytes Relative: 9 %
Neutro Abs: 5.3 10*3/uL (ref 1.7–7.7)
Neutrophils Relative %: 63 %
PLATELETS: 234 10*3/uL (ref 150–400)
RBC: 3.87 MIL/uL — AB (ref 4.22–5.81)
RDW: 13.5 % (ref 11.5–15.5)
WBC: 8.6 10*3/uL (ref 4.0–10.5)

## 2017-10-02 LAB — COMPREHENSIVE METABOLIC PANEL
ALBUMIN: 3.1 g/dL — AB (ref 3.5–5.0)
ALK PHOS: 64 U/L (ref 38–126)
ALT: 14 U/L (ref 0–44)
ANION GAP: 7 (ref 5–15)
AST: 16 U/L (ref 15–41)
CALCIUM: 8.8 mg/dL — AB (ref 8.9–10.3)
CO2: 26 mmol/L (ref 22–32)
Chloride: 110 mmol/L (ref 98–111)
Creatinine, Ser: 0.81 mg/dL (ref 0.61–1.24)
GFR calc Af Amer: >60 mL/min (ref >60)
GLUCOSE: 105 mg/dL — AB (ref 70–99)
Potassium: 3.6 mmol/L (ref 3.5–5.1)
Sodium: 143 mmol/L (ref 135–145)
TOTAL PROTEIN: 5.4 g/dL — AB (ref 6.5–8.1)
Total Bilirubin: 0.7 mg/dL (ref 0.3–1.2)

## 2017-10-02 LAB — POCT I-STAT 3, VENOUS BLOOD GAS (G3P V)
Acid-base deficit: 4 mmol/L — ABNORMAL HIGH (ref 0.0–2.0)
Bicarbonate: 20.5 mmol/L (ref 20.0–28.0)
O2 Saturation: 68 %
TCO2: 21 mmol/L — ABNORMAL LOW (ref 22–32)
pCO2, Ven: 32 mmHg — ABNORMAL LOW (ref 44.0–60.0)
pH, Ven: 7.414 (ref 7.250–7.430)
pO2, Ven: 34 mmHg (ref 32.0–45.0)

## 2017-10-02 LAB — PROTIME-INR
INR: 1.13
PROTHROMBIN TIME: 14.4 s (ref 11.4–15.2)

## 2017-10-02 LAB — MAGNESIUM: Magnesium: 1.5 mg/dL — ABNORMAL LOW (ref 1.7–2.4)

## 2017-10-02 LAB — TROPONIN I: Troponin I: 0.81 ng/mL (ref <0.03)

## 2017-10-02 SURGERY — RIGHT/LEFT HEART CATH AND CORONARY ANGIOGRAPHY
Anesthesia: LOCAL

## 2017-10-02 MED ORDER — SODIUM CHLORIDE 0.9% FLUSH: 3.0000 mL | INTRAVENOUS | Status: DC | PRN

## 2017-10-02 MED ORDER — HEPARIN (PORCINE) IN NACL 1000-0.9 UT/500ML-% IV SOLN
INTRAVENOUS | Status: AC
  Filled 2017-10-02: qty 1000

## 2017-10-02 MED ORDER — SODIUM CHLORIDE 0.9 % IV SOLN: 250.0000 mL | INTRAVENOUS | Status: DC | PRN

## 2017-10-02 MED ORDER — IOHEXOL 350 MG/ML SOLN
INTRAVENOUS | Status: DC | PRN
  Administered 2017-10-02: 55 mL via INTRACARDIAC

## 2017-10-02 MED ORDER — MIDAZOLAM HCL 2 MG/2ML IJ SOLN
INTRAMUSCULAR | Status: AC
  Filled 2017-10-02: qty 2

## 2017-10-02 MED ORDER — MIDAZOLAM HCL 2 MG/2ML IJ SOLN
INTRAMUSCULAR | Status: DC | PRN
  Administered 2017-10-02: 2 mg via INTRAVENOUS

## 2017-10-02 MED ORDER — MAGNESIUM SULFATE 2 GM/50ML IV SOLN
2.0000 g | Freq: Once | INTRAVENOUS | Status: AC
  Administered 2017-10-02: 2 g via INTRAVENOUS
  Filled 2017-10-02: qty 50

## 2017-10-02 MED ORDER — SODIUM CHLORIDE 0.9% FLUSH
3.0000 mL | Freq: Two times a day (BID) | INTRAVENOUS | Status: DC
  Administered 2017-10-02 – 2017-10-03 (×2): 3 mL via INTRAVENOUS

## 2017-10-02 MED ORDER — LIDOCAINE HCL (PF) 1 % IJ SOLN
INTRAMUSCULAR | Status: AC
  Filled 2017-10-02: qty 30

## 2017-10-02 MED ORDER — VERAPAMIL HCL 2.5 MG/ML IV SOLN
INTRAVENOUS | Status: AC
  Filled 2017-10-02: qty 2

## 2017-10-02 MED ORDER — HEPARIN SODIUM (PORCINE) 1000 UNIT/ML IJ SOLN
INTRAMUSCULAR | Status: AC
  Filled 2017-10-02: qty 1

## 2017-10-02 MED ORDER — SODIUM CHLORIDE 0.9 % IV SOLN: INTRAVENOUS | Status: AC

## 2017-10-02 MED ORDER — FENTANYL CITRATE (PF) 100 MCG/2ML IJ SOLN
INTRAMUSCULAR | Status: AC
  Filled 2017-10-02: qty 2

## 2017-10-02 MED ORDER — HEPARIN SODIUM (PORCINE) 1000 UNIT/ML IJ SOLN
INTRAMUSCULAR | Status: DC | PRN
  Administered 2017-10-02: 3000 [IU] via INTRAVENOUS

## 2017-10-02 MED ORDER — VERAPAMIL HCL 2.5 MG/ML IV SOLN
INTRAVENOUS | Status: DC | PRN
  Administered 2017-10-02: 10 mL via INTRA_ARTERIAL

## 2017-10-02 MED ORDER — HEPARIN (PORCINE) IN NACL 1000-0.9 UT/500ML-% IV SOLN
INTRAVENOUS | Status: DC | PRN
  Administered 2017-10-02 (×2): 500 mL

## 2017-10-02 MED ORDER — FENTANYL CITRATE (PF) 100 MCG/2ML IJ SOLN
INTRAMUSCULAR | Status: DC | PRN
  Administered 2017-10-02: 50 ug via INTRAVENOUS

## 2017-10-02 MED ORDER — LIDOCAINE HCL (PF) 1 % IJ SOLN
INTRAMUSCULAR | Status: DC | PRN
  Administered 2017-10-02 (×2): 2 mL

## 2017-10-02 MED ORDER — HEPARIN SODIUM (PORCINE) 5000 UNIT/ML IJ SOLN: 5000.0000 [IU] | Freq: Three times a day (TID) | INTRAMUSCULAR | Status: DC

## 2017-10-02 SURGICAL SUPPLY — 15 items
CATH 5FR JL3.5 JR4 ANG PIG MP (CATHETERS) ×2 IMPLANT
CATH BALLN WEDGE 5F 110CM (CATHETERS) ×2 IMPLANT
CATH INFINITI 5FR AL1 (CATHETERS) ×2 IMPLANT
DEVICE RAD COMP TR BAND LRG (VASCULAR PRODUCTS) ×2 IMPLANT
ELECT DEFIB PAD ADLT CADENCE (PAD) ×2 IMPLANT
GLIDESHEATH SLEND SS 6F .021 (SHEATH) ×2 IMPLANT
GUIDEWIRE .025 260CM (WIRE) ×2 IMPLANT
GUIDEWIRE INQWIRE 1.5J.035X260 (WIRE) ×1 IMPLANT
INQWIRE 1.5J .035X260CM (WIRE) ×2
KIT HEART LEFT (KITS) ×2 IMPLANT
PACK CARDIAC CATHETERIZATION (CUSTOM PROCEDURE TRAY) ×2 IMPLANT
SHEATH GLIDE SLENDER 4/5FR (SHEATH) ×2 IMPLANT
TRANSDUCER W/STOPCOCK (MISCELLANEOUS) ×2 IMPLANT
TUBING CIL FLEX 10 FLL-RA (TUBING) ×2 IMPLANT
WIRE EMERALD ST .035X150CM (WIRE) ×2 IMPLANT

## 2017-10-02 NOTE — Interval H&P Note (Signed)
History and Physical Interval Note:  10/02/2017 2:55 PM  Austin Francis  has presented today for cardiac cath with the diagnosis of severe aortic stenosis. The various methods of treatment have been discussed with the patient and family. After consideration of risks, benefits and other options for treatment, the patient has consented to  Procedure(s): LEFT HEART CATH AND CORONARY ANGIOGRAPHY (N/A) as a surgical intervention .  The patient's history has been reviewed, patient examined, no change in status, stable for surgery.  I have reviewed the patient's chart and labs.  Questions were answered to the patient's satisfaction.    Cath Lab Visit (complete for each Cath Lab visit)  Clinical Evaluation Leading to the Procedure:   ACS: yes  Non-ACS:    Anginal Classification: No Symptoms  Anti-ischemic medical therapy: No Therapy  Non-Invasive Test Results: No non-invasive testing performed  Prior CABG: No previous CABG        Verne Carrowhristopher Owenn Rothermel

## 2017-10-02 NOTE — Progress Notes (Signed)
[facility] Progress Note  Patient: Austin Francis NGE:[id number]   PCP: Patient, No Pcp Per DOB: [birth year]   DOA: 09/30/2017   DOS: 10/02/2017   Date of Service: the patient was seen and examined on 10/02/2017  Subjective: Feeling better no chest pain or shortness of breath.  No dizziness or lightheadedness.  Brief hospital course: Pt. with PMH of active smoker, undiagnosed COPD; admitted on 09/30/2017, presented with complaint of unresponsive episode, was found to have drug overdose.  Also had elevated troponin work-up which showed severe aortic stenosis now needing further work-up. Currently further plan is continue further work-up for cardiac status.  Assessment and Plan: 1.  Accidental drug overdose. Unresponsive episode. Presented with unresponsive episode, EMS was called. EMS gave patient Narcan twice which made the patient alert awake and oriented x3. Patient legally obtains Percocet and snorted 3 Percocet tablet and smoked weed before coming to the hospital. Currently alert awake and oriented x3 denies any acute complaint.  No suicidal ideation.  Continue to monitor in the hospital.  2.  Elevated troponin. Severe aortic stenosis. Cardiology was consulted. EKG showed dynamic changes. Troponins were elevated. Echocardiogram shows preserved EF but shows severe aortic stenosis. Cardiology was consulted appreciate input. At present the patient does not require any IV anticoagulation but he definitely requires left heart cardiac catheterization. Patient was initially refusing to consider cardiac catheterization and wanting to leave AMA. He wanted to leave AMA yesterday. After extensive discussion with patient patient's mother as well as patient decided to stay in the hospital.  Underwent left heart cath which is negative for any coronary artery disease. Patient will be evaluated by cardio thoracic surgery for further work-up as well as patient will get CT angio chest abdomen and  pelvis.  3.  Acute kidney injury. Likely from dehydration. After aggressive hydration renal function is almost normalized. We will continue IV fluids for now given that the patient is going to go for CT scan with contrast after cardiac catheterization  4.  Leukocytosis. Likely reactive. Pancultured. Discontinue antibiotics. Monitor.  5.  Active smoker. Counseled to quit tobacco. Patient wants to use nicotine patch on discharge. Patient also probably has undiagnosed COPD. Continue nebulizers. Will require PFT prior to considering any surgery.  Diet: Cardiac diet, DVT Prophylaxis: subcutaneous Heparin  Advance goals of care discussion: full code  Family Communication: family was present at bedside, at the time of interview. The pt provided permission to discuss medical plan with the family. Opportunity was given to ask question and all questions were answered satisfactorily.   Disposition:  Discharge to home.  Consultants: cardiology  Procedures: Echocardiogram   Antibiotics: Anti-infectives (From admission, onward)   Start     Dose/Rate Route Frequency Ordered Stop   10/01/17 0300  ceFEPIme (MAXIPIME) 1 g in sodium chloride 0.9 % 100 mL IVPB  Status:  Discontinued     1 g 200 mL/hr over 30 Minutes Intravenous Every 24 hours 09/30/17 0300 09/30/17 1956   09/30/17 2200  ceFEPIme (MAXIPIME) 1 g in sodium chloride 0.9 % 100 mL IVPB  Status:  Discontinued     1 g 200 mL/hr over 30 Minutes Intravenous Every 8 hours 09/30/17 1956 10/01/17 0753   09/30/17 2000  vancomycin (VANCOCIN) 500 mg in sodium chloride 0.9 % 100 mL IVPB  Status:  Discontinued     500 mg 100 mL/hr over 60 Minutes Intravenous Every 12 hours 09/30/17 1950 09/30/17 1956   09/30/17 2000  vancomycin (VANCOCIN) IVPB 750 mg/150 ml premix  Status:  Discontinued     750 mg 150 mL/hr over 60 Minutes Intravenous Every 12 hours 09/30/17 1956 10/01/17 0753   09/30/17 1600  vancomycin (VANCOCIN) 500 mg in sodium  chloride 0.9 % 100 mL IVPB  Status:  Discontinued     500 mg 100 mL/hr over 60 Minutes Intravenous Every 12 hours 09/30/17 0300 09/30/17 1950   09/30/17 0300  ceFEPIme (MAXIPIME) 2 g in sodium chloride 0.9 % 100 mL IVPB     2 g 200 mL/hr over 30 Minutes Intravenous  Once 09/30/17 0249 09/30/17 0345   09/30/17 0300  metroNIDAZOLE (FLAGYL) IVPB 500 mg  Status:  Discontinued     500 mg 100 mL/hr over 60 Minutes Intravenous Every 8 hours 09/30/17 0249 10/01/17 0753   09/30/17 0300  vancomycin (VANCOCIN) IVPB 1000 mg/200 mL premix     1,000 mg 200 mL/hr over 60 Minutes Intravenous  Once 09/30/17 0249 09/30/17 0608       Objective: Physical Exam: Vitals:   10/02/17 1556 10/02/17 1601 10/02/17 1605 10/02/17 1637  BP: 120/86 126/87 120/82 119/87  Pulse: 97 83 78 84  Resp: 11 10 (!) 24 16  Temp:      TempSrc:      SpO2: 97% 98% 96%   Weight:      Height:        Intake/Output Summary (Last 24 hours) at 10/02/2017 1707 Last data filed at 10/02/2017 0534 Gross per 24 hour  Intake 357 ml  Output 1100 ml  Net -743 ml   Filed Weights   09/30/17 0023 09/30/17 1612 10/01/17 1946  Weight: 59 kg 57.8 kg 55.2 kg   General: Alert, Awake and Oriented to Time, Place and Person. Appear in mild distress, affect appropriate Eyes: PERRL, Conjunctiva normal ENT: Oral Mucosa clear moist. Neck: no JVD, no Abnormal Mass Or lumps Cardiovascular: S1 and S2 Present, no Murmur, Peripheral Pulses Present Respiratory: normal respiratory effort, Bilateral Air entry equal and Decreased, no use of accessory muscle, Clear to Auscultation, no Crackles, no wheezes Abdomen: Bowel Sound present, Soft and no tenderness, no hernia Skin: on redness, no Rash, no induration Extremities: no Pedal edema, no calf tenderness Neurologic: Grossly no focal neuro deficit. Bilaterally Equal motor strength  Data Reviewed: CBC: Recent Labs  Lab 09/30/17 0035 09/30/17 1639 10/01/17 0259 10/02/17 0316  WBC 29.7* 16.6*  15.9* 8.6  NEUTROABS 26.7*  --   --  5.3  HGB 15.6 13.3 12.3* 12.2*  HCT 46.7 40.9 36.2* 36.6*  MCV 95.3 98.3 94.5 94.6  PLT 391 277 262 234   Basic Metabolic Panel: Recent Labs  Lab 09/30/17 0035 09/30/17 1221 10/01/17 0259 10/02/17 0316  NA 137  --  139 143  K 3.3*  --  3.1* 3.6  CL 101  --  107 110  CO2 23  --  23 26  GLUCOSE 256*  --  117* 105*  BUN 8  --  5* <5*  CREATININE 1.56* 1.13 0.94 0.81  CALCIUM 8.9  --  8.6* 8.8*  MG  --  1.5*  --  1.5*  PHOS  --  3.5  --   --     Liver Function Tests: Recent Labs  Lab 09/30/17 0035 10/02/17 0316  AST 25 16  ALT 16 14  ALKPHOS 93 64  BILITOT 0.5 0.7  PROT 6.2* 5.4*  ALBUMIN 3.9 3.1*   No results for input(s): LIPASE, AMYLASE in the last 168 hours. No results for input(s): AMMONIA in the  last 168 hours. Coagulation Profile: Recent Labs  Lab 10/02/17 0316  INR 1.13   Cardiac Enzymes: Recent Labs  Lab 09/30/17 0356 09/30/17 1035 09/30/17 1221 09/30/17 1639 09/30/17 1918 10/02/17 0316  CKTOTAL  --   --  199  --   --   --   TROPONINI 1.56* 2.96*  --  1.98* 2.05* 0.81*   BNP (last 3 results) No results for input(s): PROBNP in the last 8760 hours. CBG: No results for input(s): GLUCAP in the last 168 hours. Studies: No results found.  Scheduled Meds: . [START ON 10/03/2017] aspirin EC  81 mg Oral Daily  . budesonide (PULMICORT) nebulizer solution  0.25 mg Nebulization BID  . folic acid  1 mg Oral Daily  . [START ON 10/03/2017] heparin  5,000 Units Subcutaneous Q8H  . Influenza vac split quadrivalent PF  0.5 mL Intramuscular Tomorrow-1000  . LORazepam  0-4 mg Intravenous Q12H  . multivitamin with minerals  1 tablet Oral Daily  . nicotine  21 mg Transdermal Daily  . sodium chloride flush  3 mL Intravenous Q12H  . thiamine  100 mg Oral Daily   Or  . thiamine  100 mg Intravenous Daily   Continuous Infusions: . sodium chloride    . sodium chloride     PRN Meds: sodium chloride, acetaminophen,  ipratropium-albuterol, LORazepam **OR** LORazepam, sodium chloride flush  Time spent: 35 minutes  Author: Lynden OxfordPranav Diarra Ceja, MD [facility] Pager: [phone] 10/02/2017 5:07 PM  If 7PM-7AM, please contact night-coverage at [email or web address], password [email or web address]

## 2017-10-02 NOTE — H&P (View-Only) (Signed)
DAILY PROGRESS NOTE   Patient Name: Austin Francis Date of Encounter: 10/02/2017  Chief Complaint   No chest pain  Patient Profile   47 yo male with opiod overdose and associated hypoxic respiratory arrest, revived with narcan and found to have elevated troponin.  Subjective   Austin Francis is now agreeable to [facility] today - I think this is wise and expect to consult CT surgery for their opinion afterwards.  Objective   Vitals:   10/01/17 2300 10/02/17 0551 10/02/17 0738 10/02/17 0744  BP: 120/82 122/83  120/90  Pulse: 80 71  74  Resp: _0 Temp: 98 F (36.7 C) 98 F (36.7 C)  98.2 F (36.8 C)  TempSrc: Oral   Oral  SpO2: 98% 98% 98% 100%  Weight:      Height:        Intake/Output Summary (Last 24 hours) at 10/02/2017 0908 Last data filed at 10/02/2017 0534 Gross per 24 hour  Intake 717 ml  Output 1100 ml  Net -383 ml   Filed Weights   09/30/17 0023 09/30/17 1612 10/01/17 1946  Weight: 59 kg 57.8 kg 55.2 kg    Physical Exam   General appearance: alert, no distress and mildly irritable Neck: no carotid bruit, no JVD and thyroid not enlarged, symmetric, no tenderness/mass/nodules Lungs: clear to auscultation bilaterally Heart: regular rate and rhythm and systolic murmur: systolic ejection 3/6, crescendo at 2nd right intercostal space Abdomen: soft, non-tender; bowel sounds normal; no masses,  no organomegaly Extremities: extremities normal, atraumatic, no cyanosis or edema Pulses: 2+ and symmetric Skin: Skin color, texture, turgor normal. No rashes or lesions Neurologic: Grossly normal Psych: Upset  Inpatient Medications    Scheduled Meds: . [START ON 10/03/2017] aspirin EC  81 mg Oral Daily  . budesonide (PULMICORT) nebulizer solution  0.25 mg Nebulization BID  . folic acid  1 mg Oral Daily  . heparin  5,000 Units Subcutaneous Q8H  . Influenza vac split quadrivalent PF  0.5 mL Intramuscular Tomorrow-1000  . LORazepam  0-4 mg Intravenous Q12H  .  multivitamin with minerals  1 tablet Oral Daily  . nicotine  21 mg Transdermal Daily  . sodium chloride flush  3 mL Intravenous Q12H  . thiamine  100 mg Oral Daily   Or  . thiamine  100 mg Intravenous Daily    Continuous Infusions: . sodium chloride 100 mL/hr at 10/01/17 1451  . sodium chloride    . sodium chloride 1 mL/kg/hr (10/02/17 0530)  . magnesium sulfate 1 - 4 g bolus IVPB 2 g (10/02/17 0900)    PRN Meds: sodium chloride, acetaminophen, ipratropium-albuterol, LORazepam **OR** LORazepam, sodium chloride flush   Labs   Results for orders placed or performed during the hospital encounter of 09/30/17 (from the past 48 hour(s))  Troponin I (q 6hr x 3)     Status: Abnormal   Collection Time: 09/30/17 10:35 AM  Result Value Ref Range   Troponin I 2.96 (HH) <0.03 ng/mL    Comment: CRITICAL RESULT CALLED TO, READ BACK BY AND VERIFIED WITH: [name],RN AT 1148 09/30/17 BY ZBEECH. Performed at [facility], [place] [address] [place], [place] [address]   HIV antibody (Routine Testing)     Status: None   Collection Time: 09/30/17 12:21 PM  Result Value Ref Range   HIV Screen 4th Generation wRfx Non Reactive Non Reactive    Comment: (NOTE) Performed At: [facility] [address] [place], [place] [id number] Perlie Gold  Derinda Late MD QI:[id number]   Creatinine, serum     Status: None   Collection Time: 09/30/17 12:21 PM  Result Value Ref Range   Creatinine, Ser 1.13 0.61 - 1.24 mg/dL   GFR calc non Af Amer >60 >60 mL/min   GFR calc Af Amer >60 >60 mL/min    Comment: (NOTE) The eGFR has been calculated using the CKD EPI equation. This calculation has not been validated in all clinical situations. eGFR's persistently <60 mL/min signify possible Chronic Kidney Disease. Performed at [facility], [place] [address] [place], [place] [address]   Magnesium     Status: Abnormal   Collection Time: 09/30/17 12:21 PM  Result Value Ref Range   Magnesium 1.5 (L) 1.7 - 2.4  mg/dL    Comment: Performed at [place] [address] [place], [place] [address]  Phosphorus     Status: None   Collection Time: 09/30/17 12:21 PM  Result Value Ref Range   Phosphorus 3.5 2.5 - 4.6 mg/dL    Comment: Performed at [place] [address], [place], [place] [address]  CK     Status: None   Collection Time: 09/30/17 12:21 PM  Result Value Ref Range   Total CK 199 49 - 397 U/L    Comment: Performed at [facility], [place] [address] [place], [place] [address]  ANA, IFA (with reflex)     Status: Abnormal   Collection Time: 09/30/17 12:21 PM  Result Value Ref Range   ANA Ab, IFA Positive (A)     Comment: (NOTE)                                     Negative   <1:80                                     Borderline  1:80                                     Positive   >1:80 Performed At: [facility] [place], [place] [id number] Rush Farmer MD OY:[id number]   Procalcitonin - Baseline     Status: None   Collection Time: 09/30/17 12:21 PM  Result Value Ref Range   Procalcitonin 23.12 ng/mL    Comment:        Interpretation: PCT >= 10 ng/mL: Important systemic inflammatory response, almost exclusively due to severe bacterial sepsis or septic shock. (NOTE)       Sepsis PCT Algorithm           Lower Respiratory Tract                                      Infection PCT Algorithm    ----------------------------     ----------------------------         PCT < 0.25 ng/mL                PCT < 0.10 ng/mL         Strongly encourage             Strongly discourage   discontinuation of antibiotics  initiation of antibiotics    ----------------------------     -----------------------------       PCT 0.25 - 0.50 ng/mL            PCT 0.10 - 0.25 ng/mL               OR       >80% decrease in PCT            Discourage initiation of                                            antibiotics      Encourage discontinuation           of  antibiotics    ----------------------------     -----------------------------         PCT >= 0.50 ng/mL              PCT 0.26 - 0.50 ng/mL                AND       <80% decrease in PCT             Encourage initiation of                                             antibiotics       Encourage continuation           of antibiotics    ----------------------------     -----------------------------        PCT >= 0.50 ng/mL                  PCT > 0.50 ng/mL               AND         increase in PCT                  Strongly encourage                                      initiation of antibiotics    Strongly encourage escalation           of antibiotics                                     -----------------------------                                           PCT <= 0.25 ng/mL                                                 OR                                        >  80% decrease in PCT                                     Discontinue / Do not initiate                                             antibiotics Performed at [facility], [place] [address], [place], [place] [address]   FANA Staining Patterns     Status: Abnormal   Collection Time: 09/30/17 12:21 PM  Result Value Ref Range   Homogeneous Pattern 1:160 (H)    Speckled Pattern 1:80    Note: Comment     Comment: (NOTE) A positive ANA result may occur in healthy individuals (low titer) or be associated with a variety of diseases.  See interpretation chart which is not all inclusive: Pattern      Antigen Detected  Suggested Disease Association -----------  ----------------  ----------------------------- Homogeneous  DNA(ds,ss),       SLE - High titers             Nucleosomes,             Histones          Drug-induced SLE -----------  ----------------  ----------------------------- Speckled     Sm, RNP, SCL-70,  SLE,MCTD,PSS (diffuse form),             SS-A/SS-B         Sjogrens -----------  ----------------   ----------------------------- Nucleolar    SCL-70, PM-1/SCL  High titers Scleroderma,                               PM/DM -----------  ----------------  ----------------------------- Centromere   Centromere        PSS (limited form) w/Crest                               syndrome variable -----------  ----------------  ----------------------------- Nuclear Dot  Sp100,p80-c oilin  Primary Biliary Cirrhosis -----------  ----------------  ----------------------------- Nuclear      GP210,            Primary Biliary Cirrhosis Membrane     lamin A,B,C -----------  ----------------  ----------------------------- Performed At: [facility] [address] [place], [place] [id number] Rush Farmer MD [id number]   Sodium, urine, random     Status: None   Collection Time: 09/30/17 12:50 PM  Result Value Ref Range   Sodium, Ur 66 mmol/L    Comment: Performed at [facility], [address] [place], [place] [address]  Protein, urine, random     Status: None   Collection Time: 09/30/17 12:50 PM  Result Value Ref Range   Total Protein, Urine <6.0 mg/dL    Comment: REPEATED TO VERIFY Performed at [place] [address], [place], [place] [address]   Creatinine, urine, random     Status: None   Collection Time: 09/30/17 12:50 PM  Result Value Ref Range   Creatinine, Urine 16.65 mg/dL    Comment: Performed at [facility], [place] [address], [place], [place] [address]  Troponin I     Status: Abnormal  Collection Time: 09/30/17  4:39 PM  Result Value Ref Range   Troponin I 1.98 (HH) <0.03 ng/mL    Comment: CRITICAL VALUE NOTED.  VALUE IS CONSISTENT WITH PREVIOUSLY REPORTED AND CALLED VALUE. Performed at [facility], [place] [address], [place], [place] [address]   CBC     Status: Abnormal   Collection Time: 09/30/17  4:39 PM  Result Value Ref Range   WBC 16.6 (H) 4.0 - 10.5 K/uL   RBC 4.16 (L) 4.22 - 5.81 MIL/uL   Hemoglobin 13.3 13.0 - 17.0 g/dL   HCT  40.9 39.0 - 52.0 %   MCV 98.3 78.0 - 100.0 fL   MCH 32.0 26.0 - 34.0 pg   MCHC 32.5 30.0 - 36.0 g/dL   RDW 13.9 11.5 - 15.5 %   Platelets 277 150 - 400 K/uL    Comment: Performed at [facility], [place] [address] [place], [place] [address]  Troponin I (q 6hr x 3)     Status: Abnormal   Collection Time: 09/30/17  7:18 PM  Result Value Ref Range   Troponin I 2.05 (HH) <0.03 ng/mL    Comment: CRITICAL VALUE NOTED.  VALUE IS CONSISTENT WITH PREVIOUSLY REPORTED AND CALLED VALUE. Performed at [facility], [place] [address], [place], [place] [address]   Basic metabolic panel     Status: Abnormal   Collection Time: 10/01/17  2:59 AM  Result Value Ref Range   Sodium 139 135 - 145 mmol/L   Potassium 3.1 (L) 3.5 - 5.1 mmol/L   Chloride 107 98 - 111 mmol/L   CO2 23 22 - 32 mmol/L   Glucose, Bld 117 (H) 70 - 99 mg/dL   BUN 5 (L) 6 - 20 mg/dL   Creatinine, Ser 0.94 0.61 - 1.24 mg/dL   Calcium 8.6 (L) 8.9 - 10.3 mg/dL   GFR calc non Af Amer >60 >60 mL/min   GFR calc Af Amer >60 >60 mL/min    Comment: (NOTE) The eGFR has been calculated using the CKD EPI equation. This calculation has not been validated in all clinical situations. eGFR's persistently <60 mL/min signify possible Chronic Kidney Disease.    Anion gap 9 5 - 15    Comment: Performed at [place] [address], [place], [place] [address]  CBC     Status: Abnormal   Collection Time: 10/01/17  2:59 AM  Result Value Ref Range   WBC 15.9 (H) 4.0 - 10.5 K/uL   RBC 3.83 (L) 4.22 - 5.81 MIL/uL   Hemoglobin 12.3 (L) 13.0 - 17.0 g/dL   HCT 36.2 (L) 39.0 - 52.0 %   MCV 94.5 78.0 - 100.0 fL   MCH 32.1 26.0 - 34.0 pg   MCHC 34.0 30.0 - 36.0 g/dL   RDW 13.6 11.5 - 15.5 %   Platelets 262 150 - 400 K/uL    Comment: Performed at [facility], [place] [address], [place], [place] [address]  Comprehensive metabolic panel     Status: Abnormal   Collection Time: 10/02/17  3:16 AM  Result Value Ref Range   Sodium 143  135 - 145 mmol/L   Potassium 3.6 3.5 - 5.1 mmol/L   Chloride 110 98 - 111 mmol/L   CO2 26 22 - 32 mmol/L   Glucose, Bld 105 (H) 70 - 99 mg/dL   BUN <5 (L) 6 - 20 mg/dL   Creatinine, Ser 0.81 0.61 - 1.24 mg/dL  Calcium 8.8 (L) 8.9 - 10.3 mg/dL   Total Protein 5.4 (L) 6.5 - 8.1 g/dL   Albumin 3.1 (L) 3.5 - 5.0 g/dL   AST 16 15 - 41 U/L   ALT 14 0 - 44 U/L   Alkaline Phosphatase 64 38 - 126 U/L   Total Bilirubin 0.7 0.3 - 1.2 mg/dL   GFR calc non Af Amer >60 >60 mL/min   GFR calc Af Amer >60 >60 mL/min    Comment: (NOTE) The eGFR has been calculated using the CKD EPI equation. This calculation has not been validated in all clinical situations. eGFR's persistently <60 mL/min signify possible Chronic Kidney Disease.    Anion gap 7 5 - 15    Comment: Performed at [place] [address] [place], [place] [address]  CBC with Differential/Platelet     Status: Abnormal   Collection Time: 10/02/17  3:16 AM  Result Value Ref Range   WBC 8.6 4.0 - 10.5 K/uL   RBC 3.87 (L) 4.22 - 5.81 MIL/uL   Hemoglobin 12.2 (L) 13.0 - 17.0 g/dL   HCT 36.6 (L) 39.0 - 52.0 %   MCV 94.6 78.0 - 100.0 fL   MCH 31.5 26.0 - 34.0 pg   MCHC 33.3 30.0 - 36.0 g/dL   RDW 13.5 11.5 - 15.5 %   Platelets 234 150 - 400 K/uL   Neutrophils Relative % 63 %   Neutro Abs 5.3 1.7 - 7.7 K/uL   Lymphocytes Relative 24 %   Lymphs Abs 2.1 0.7 - 4.0 K/uL   Monocytes Relative 9 %   Monocytes Absolute 0.8 0.1 - 1.0 K/uL   Eosinophils Relative 3 %   Eosinophils Absolute 0.3 0.0 - 0.7 K/uL   Basophils Relative 1 %   Basophils Absolute 0.1 0.0 - 0.1 K/uL   Immature Granulocytes 0 %   Abs Immature Granulocytes 0.0 0.0 - 0.1 K/uL    Comment: Performed at [place] [address], [place], [place] [address]  Protime-INR     Status: None   Collection Time: 10/02/17  3:16 AM  Result Value Ref Range   Prothrombin Time 14.4 11.4 - 15.2 seconds   INR 1.13     Comment: Performed at [place] [address] [place], [place] [address]  Magnesium     Status: Abnormal   Collection Time: 10/02/17  3:16 AM  Result Value Ref Range   Magnesium 1.5 (L) 1.7 - 2.4 mg/dL    Comment: Performed at [place] [address] [place], [place] [address]  Troponin I     Status: Abnormal   Collection Time: 10/02/17  3:16 AM  Result Value Ref Range   Troponin I 0.81 (HH) <0.03 ng/mL    Comment: CRITICAL RESULT CALLED TO, READ BACK BY AND VERIFIED WITH: T.DOBSON,RN 6270 10/02/17 M.CAMPBELL Performed at [facility], [place] [address] [place], [place] [address]     ECG   N/A  Telemetry   Sinus rhythm - Personally Reviewed  Radiology    US Renal  Result Date: 09/30/2017 CLINICAL DATA:  47 year old male with acute kidney insufficiency. Initial encounter. EXAM: RENAL / URINARY TRACT ULTRASOUND COMPLETE COMPARISON:  None. FINDINGS: Right Kidney: Length: 11.6 cm. Echogenicity within normal limits. No mass or hydronephrosis visualized. Left Kidney: Length: 11.4 cm. Echogenicity within normal limits. No mass or hydronephrosis visualized. Bladder: Appears normal for degree of bladder distention. Accessory splenic tissue suspected. Small bilateral pleural  effusions. IMPRESSION: 1. No hydronephrosis. 2. Small bilateral pleural effusions. Electronically Signed   By: Genia Del M.D.   On: 09/30/2017 12:24    Cardiac Studies   LV EF: 55% -   60%  ------------------------------------------------------------------- History:   PMH:  Elevated Troponin.  Atrial fibrillation.  Risk factors:  Hypertension. Diabetes mellitus. Dyslipidemia.  ------------------------------------------------------------------- Study Conclusions  - Left ventricle: The cavity size was normal. Systolic function was   normal. The estimated ejection fraction was in the range of 55%   to 60%. Wall motion was normal; there were no regional wall   motion abnormalities. Left ventricular diastolic function    parameters were normal. - Aortic valve: Possibly bicuspid; moderately thickened, mildly   calcified leaflets. Severe focal calcification involving the   posterior cusp. There was severe stenosis. There was mild to   moderate regurgitation directed centrally in the LVOT. Valve area   (VTI): 1.02 cm^2. Valve area (Vmax): 1.01 cm^2. Valve area   (Vmean): 1 cm^2. - Tricuspid valve: There was mild-moderate regurgitation. - Pulmonary arteries: Systolic pressure was mildly increased. PA   peak pressure: 45 mm Hg (S).  Assessment   Principal Problem:   Accidental overdose Active Problems:   Unconsciousness (HCC)   Non-ST elevation (NSTEMI) myocardial infarction [name])   Aortic stenosis   Plan   1. Hemodynamically stable. No chest pain. Plan for [facility] today for elevated troponin and severe aortic stenosis, preoperative work-up. I again covered the risks, benefits and alternatives of cath with him and feel the risk/benefit ratio is favorable for him to proceed. He agrees and has consented to the procedure.   Time Spent Directly with Patient:  I have spent a total of 15 minutes with the patient reviewing hospital notes, telemetry, EKGs, labs and examining the patient as well as establishing an assessment and plan that was discussed personally with the patient.  > 50% of time was spent in direct patient care.  Length of Stay:  LOS: 2 days   Pixie Casino, MD, [facility], [place] Director of the Advanced Lipid Disorders &  Cardiovascular Risk Reduction Clinic Diplomate of the American Board of Clinical Lipidology Attending Cardiologist  Direct Dial: [phone]  Fax: [phone]  Website:  [email or web address] 10/02/2017, 9:08 AM

## 2017-10-02 NOTE — Progress Notes (Signed)
 DAILY PROGRESS NOTE   Patient Name: Austin Francis Date of Encounter: 10/02/2017  Chief Complaint   No chest pain  Patient Profile   47 yo male with opiod overdose and associated hypoxic respiratory arrest, revived with narcan and found to have elevated troponin.  Subjective   Mr. Austin Francis is now agreeable to LHC today - I think this is wise and expect to consult CT surgery for their opinion afterwards.  Objective   Vitals:   10/01/17 2300 10/02/17 0551 10/02/17 0738 10/02/17 0744  BP: 120/82 122/83  120/90  Pulse: 80 71  74  Resp: 16 16  14  Temp: 98 F (36.7 C) 98 F (36.7 C)  98.2 F (36.8 C)  TempSrc: Oral   Oral  SpO2: 98% 98% 98% 100%  Weight:      Height:        Intake/Output Summary (Last 24 hours) at 10/02/2017 0908 Last data filed at 10/02/2017 0534 Gross per 24 hour  Intake 717 ml  Output 1100 ml  Net -383 ml   Filed Weights   09/30/17 0023 09/30/17 1612 10/01/17 1946  Weight: 59 kg 57.8 kg 55.2 kg    Physical Exam   General appearance: alert, no distress and mildly irritable Neck: no carotid bruit, no JVD and thyroid not enlarged, symmetric, no tenderness/mass/nodules Lungs: clear to auscultation bilaterally Heart: regular rate and rhythm and systolic murmur: systolic ejection 3/6, crescendo at 2nd right intercostal space Abdomen: soft, non-tender; bowel sounds normal; no masses,  no organomegaly Extremities: extremities normal, atraumatic, no cyanosis or edema Pulses: 2+ and symmetric Skin: Skin color, texture, turgor normal. No rashes or lesions Neurologic: Grossly normal Psych: Upset  Inpatient Medications    Scheduled Meds: . [START ON 10/03/2017] aspirin EC  81 mg Oral Daily  . budesonide (PULMICORT) nebulizer solution  0.25 mg Nebulization BID  . folic acid  1 mg Oral Daily  . heparin  5,000 Units Subcutaneous Q8H  . Influenza vac split quadrivalent PF  0.5 mL Intramuscular Tomorrow-1000  . LORazepam  0-4 mg Intravenous Q12H  .  multivitamin with minerals  1 tablet Oral Daily  . nicotine  21 mg Transdermal Daily  . sodium chloride flush  3 mL Intravenous Q12H  . thiamine  100 mg Oral Daily   Or  . thiamine  100 mg Intravenous Daily    Continuous Infusions: . sodium chloride 100 mL/hr at 10/01/17 1451  . sodium chloride    . sodium chloride 1 mL/kg/hr (10/02/17 0530)  . magnesium sulfate 1 - 4 g bolus IVPB 2 g (10/02/17 0900)    PRN Meds: sodium chloride, acetaminophen, ipratropium-albuterol, LORazepam **OR** LORazepam, sodium chloride flush   Labs   Results for orders placed or performed during the hospital encounter of 09/30/17 (from the past 48 hour(s))  Troponin I (q 6hr x 3)     Status: Abnormal   Collection Time: 09/30/17 10:35 AM  Result Value Ref Range   Troponin I 2.96 (HH) <0.03 ng/mL    Comment: CRITICAL RESULT CALLED TO, READ BACK BY AND VERIFIED WITH: Austin DOOLEY,RN AT 1148 09/30/17 BY ZBEECH. Performed at [facility], [address] [place], [place] [address]   HIV antibody (Routine Testing)     Status: None   Collection Time: 09/30/17 12:21 PM  Result Value Ref Range   HIV Screen 4th Generation wRfx Non Reactive Non Reactive    Comment: (NOTE) Performed At: [facility] [address] , [place] [id number] Austin   Sanjai MD Ph:[phone]   Creatinine, serum     Status: None   Collection Time: 09/30/17 12:21 PM  Result Value Ref Range   Creatinine, Ser 1.13 0.61 - 1.24 mg/dL   GFR calc non Af Amer >60 >60 mL/min   GFR calc Af Amer >60 >60 mL/min    Comment: (NOTE) The eGFR has been calculated using the CKD EPI equation. This calculation has not been validated in all clinical situations. eGFR's persistently <60 mL/min signify possible Chronic Kidney Disease. Performed at [facility], [address] [place], [place] [address]   Magnesium     Status: Abnormal   Collection Time: 09/30/17 12:21 PM  Result Value Ref Range   Magnesium 1.5 (L) 1.7 - 2.4  mg/dL    Comment: Performed at [facility], [address] [place], [place] [address]  Phosphorus     Status: None   Collection Time: 09/30/17 12:21 PM  Result Value Ref Range   Phosphorus 3.5 2.5 - 4.6 mg/dL    Comment: Performed at [facility], [address] [place], [place] [address]  CK     Status: None   Collection Time: 09/30/17 12:21 PM  Result Value Ref Range   Total CK 199 49 - 397 U/L    Comment: Performed at  Hospital Lab, [address] [place], [place] [address]  ANA, IFA (with reflex)     Status: Abnormal   Collection Time: 09/30/17 12:21 PM  Result Value Ref Range   ANA Ab, IFA Positive (A)     Comment: (NOTE)                                     Negative   <1:80                                     Borderline  1:80                                     Positive   >1:80 Performed At: [facility] [address] [place],  [id number] Austin Sanjai MD Ph:[phone]   Procalcitonin - Baseline     Status: None   Collection Time: 09/30/17 12:21 PM  Result Value Ref Range   Procalcitonin 23.12 ng/mL    Comment:        Interpretation: PCT >= 10 ng/mL: Important systemic inflammatory response, almost exclusively due to severe bacterial sepsis or septic shock. (NOTE)       Sepsis PCT Algorithm           Lower Respiratory Tract                                      Infection PCT Algorithm    ----------------------------     ----------------------------         PCT < 0.25 ng/mL                PCT < 0.10 ng/mL         Strongly encourage             Strongly discourage   discontinuation of antibiotics      initiation of antibiotics    ----------------------------     -----------------------------       PCT 0.25 - 0.50 ng/mL            PCT 0.10 - 0.25 ng/mL               OR       >80% decrease in PCT            Discourage initiation of                                            antibiotics      Encourage discontinuation           of  antibiotics    ----------------------------     -----------------------------         PCT >= 0.50 ng/mL              PCT 0.26 - 0.50 ng/mL                AND       <80% decrease in PCT             Encourage initiation of                                             antibiotics       Encourage continuation           of antibiotics    ----------------------------     -----------------------------        PCT >= 0.50 ng/mL                  PCT > 0.50 ng/mL               AND         increase in PCT                  Strongly encourage                                      initiation of antibiotics    Strongly encourage escalation           of antibiotics                                     -----------------------------                                           PCT <= 0.25 ng/mL                                                 OR                                        >   80% decrease in PCT                                     Discontinue / Do not initiate                                             antibiotics Performed at [facility], [address] [place], [place] [address]   FANA Staining Patterns     Status: Abnormal   Collection Time: 09/30/17 12:21 PM  Result Value Ref Range   Homogeneous Pattern 1:160 (H)    Speckled Pattern 1:80    Note: Comment     Comment: (NOTE) A positive ANA result may occur in healthy individuals (low titer) or be associated with a variety of diseases.  See interpretation chart which is not all inclusive: Pattern      Antigen Detected  Suggested Disease Association -----------  ----------------  ----------------------------- Homogeneous  DNA(ds,ss),       SLE - High titers             Nucleosomes,             Histones          Drug-induced SLE -----------  ----------------  ----------------------------- Speckled     Sm, RNP, SCL-70,  SLE,MCTD,PSS (diffuse form),             SS-A/SS-B         Sjogrens -----------  ----------------   ----------------------------- Nucleolar    SCL-70, PM-1/SCL  High titers Scleroderma,                               PM/DM -----------  ----------------  ----------------------------- Centromere   Centromere        PSS (limited form) w/Crest                               syndrome variable -----------  ----------------  ----------------------------- Nuclear Dot  Sp100,p80-c oilin  Primary Biliary Cirrhosis -----------  ----------------  ----------------------------- Nuclear      GP210,            Primary Biliary Cirrhosis Membrane     lamin A,B,C -----------  ----------------  ----------------------------- Performed At: [facility] [address] [place], [place] [id number] Austin Sanjai MD Ph:[phone]   Sodium, urine, random     Status: None   Collection Time: 09/30/17 12:50 PM  Result Value Ref Range   Sodium, Ur 66 mmol/L    Comment: Performed at [facility], [address] [place], [place] [address]  Protein, urine, random     Status: None   Collection Time: 09/30/17 12:50 PM  Result Value Ref Range   Total Protein, Urine <6.0 mg/dL    Comment: REPEATED TO VERIFY Performed at [facility], [address] [place], [place] [address]   Creatinine, urine, random     Status: None   Collection Time: 09/30/17 12:50 PM  Result Value Ref Range   Creatinine, Urine 16.65 mg/dL    Comment: Performed at [facility], [address] [place],  [address]  Troponin I     Status: Abnormal     Collection Time: 09/30/17  4:39 PM  Result Value Ref Range   Troponin I 1.98 (HH) <0.03 ng/mL    Comment: CRITICAL VALUE NOTED.  VALUE IS CONSISTENT WITH PREVIOUSLY REPORTED AND CALLED VALUE. Performed at [facility], [address] [place], [place] [address]   CBC     Status: Abnormal   Collection Time: 09/30/17  4:39 PM  Result Value Ref Range   WBC 16.6 (H) 4.0 - 10.5 K/uL   RBC 4.16 (L) 4.22 - 5.81 MIL/uL   Hemoglobin 13.3 13.0 - 17.0 g/dL   HCT  40.9 39.0 - 52.0 %   MCV 98.3 78.0 - 100.0 fL   MCH 32.0 26.0 - 34.0 pg   MCHC 32.5 30.0 - 36.0 g/dL   RDW 13.9 11.5 - 15.5 %   Platelets 277 150 - 400 K/uL    Comment: Performed at [facility], [address] [place], [place] [address]  Troponin I (q 6hr x 3)     Status: Abnormal   Collection Time: 09/30/17  7:18 PM  Result Value Ref Range   Troponin I 2.05 (HH) <0.03 ng/mL    Comment: CRITICAL VALUE NOTED.  VALUE IS CONSISTENT WITH PREVIOUSLY REPORTED AND CALLED VALUE. Performed at [facility], [address] [place], [place] [address]   Basic metabolic panel     Status: Abnormal   Collection Time: 10/01/17  2:59 AM  Result Value Ref Range   Sodium 139 135 - 145 mmol/L   Potassium 3.1 (L) 3.5 - 5.1 mmol/L   Chloride 107 98 - 111 mmol/L   CO2 23 22 - 32 mmol/L   Glucose, Bld 117 (H) 70 - 99 mg/dL   BUN 5 (L) 6 - 20 mg/dL   Creatinine, Ser 0.94 0.61 - 1.24 mg/dL   Calcium 8.6 (L) 8.9 - 10.3 mg/dL   GFR calc non Af Amer >60 >60 mL/min   GFR calc Af Amer >60 >60 mL/min    Comment: (NOTE) The eGFR has been calculated using the CKD EPI equation. This calculation has not been validated in all clinical situations. eGFR's persistently <60 mL/min signify possible Chronic Kidney Disease.    Anion gap 9 5 - 15    Comment: Performed at [facility], [address] [place], [place] [address]  CBC     Status: Abnormal   Collection Time: 10/01/17  2:59 AM  Result Value Ref Range   WBC 15.9 (H) 4.0 - 10.5 K/uL   RBC 3.83 (L) 4.22 - 5.81 MIL/uL   Hemoglobin 12.3 (L) 13.0 - 17.0 g/dL   HCT 36.2 (L) 39.0 - 52.0 %   MCV 94.5 78.0 - 100.0 fL   MCH 32.1 26.0 - 34.0 pg   MCHC 34.0 30.0 - 36.0 g/dL   RDW 13.6 11.5 - 15.5 %   Platelets 262 150 - 400 K/uL    Comment: Performed at [facility], [address] [place], [place] [address]  Comprehensive metabolic panel     Status: Abnormal   Collection Time: 10/02/17  3:16 AM  Result Value Ref Range   Sodium 143  135 - 145 mmol/L   Potassium 3.6 3.5 - 5.1 mmol/L   Chloride 110 98 - 111 mmol/L   CO2 26 22 - 32 mmol/L   Glucose, Bld 105 (H) 70 - 99 mg/dL   BUN <5 (L) 6 - 20 mg/dL   Creatinine, Ser 0.81 0.61 - 1.24 mg/dL     Calcium 8.8 (L) 8.9 - 10.3 mg/dL   Total Protein 5.4 (L) 6.5 - 8.1 g/dL   Albumin 3.1 (L) 3.5 - 5.0 g/dL   AST 16 15 - 41 U/L   ALT 14 0 - 44 U/L   Alkaline Phosphatase 64 38 - 126 U/L   Total Bilirubin 0.7 0.3 - 1.2 mg/dL   GFR calc non Af Amer >60 >60 mL/min   GFR calc Af Amer >60 >60 mL/min    Comment: (NOTE) The eGFR has been calculated using the CKD EPI equation. This calculation has not been validated in all clinical situations. eGFR's persistently <60 mL/min signify possible Chronic Kidney Disease.    Anion gap 7 5 - 15    Comment: Performed at [facility], [address] [place], [place] [address]  CBC with Differential/Platelet     Status: Abnormal   Collection Time: 10/02/17  3:16 AM  Result Value Ref Range   WBC 8.6 4.0 - 10.5 K/uL   RBC 3.87 (L) 4.22 - 5.81 MIL/uL   Hemoglobin 12.2 (L) 13.0 - 17.0 g/dL   HCT 36.6 (L) 39.0 - 52.0 %   MCV 94.6 78.0 - 100.0 fL   MCH 31.5 26.0 - 34.0 pg   MCHC 33.3 30.0 - 36.0 g/dL   RDW 13.5 11.5 - 15.5 %   Platelets 234 150 - 400 K/uL   Neutrophils Relative % 63 %   Neutro Abs 5.3 1.7 - 7.7 K/uL   Lymphocytes Relative 24 %   Lymphs Abs 2.1 0.7 - 4.0 K/uL   Monocytes Relative 9 %   Monocytes Absolute 0.8 0.1 - 1.0 K/uL   Eosinophils Relative 3 %   Eosinophils Absolute 0.3 0.0 - 0.7 K/uL   Basophils Relative 1 %   Basophils Absolute 0.1 0.0 - 0.1 K/uL   Immature Granulocytes 0 %   Abs Immature Granulocytes 0.0 0.0 - 0.1 K/uL    Comment: Performed at [facility], [address] [place], [place] [address]  Protime-INR     Status: None   Collection Time: 10/02/17  3:16 AM  Result Value Ref Range   Prothrombin Time 14.4 11.4 - 15.2 seconds   INR 1.13     Comment: Performed at [facility],  [address] [place], [place] [address]  Magnesium     Status: Abnormal   Collection Time: 10/02/17  3:16 AM  Result Value Ref Range   Magnesium 1.5 (L) 1.7 - 2.4 mg/dL    Comment: Performed at [facility], [address] [place], [place] [address]  Troponin I     Status: Abnormal   Collection Time: 10/02/17  3:16 AM  Result Value Ref Range   Troponin I 0.81 (HH) <0.03 ng/mL    Comment: CRITICAL RESULT CALLED TO, READ BACK BY AND VERIFIED WITH: T.DOBSON,RN 0456 10/02/17 M.CAMPBELL Performed at [facility], [address] [place], [place] [address]     ECG   N/A  Telemetry   Sinus rhythm - Personally Reviewed  Radiology    Us Renal  Result Date: 09/30/2017 CLINICAL DATA:  47-year-old male with acute kidney insufficiency. Initial encounter. EXAM: RENAL / URINARY TRACT ULTRASOUND COMPLETE COMPARISON:  None. FINDINGS: Right Kidney: Length: 11.6 cm. Echogenicity within normal limits. No mass or hydronephrosis visualized. Left Kidney: Length: 11.4 cm. Echogenicity within normal limits. No mass or hydronephrosis visualized. Bladder: Appears normal for degree of bladder distention. Accessory splenic tissue suspected. Small bilateral pleural   effusions. IMPRESSION: 1. No hydronephrosis. 2. Small bilateral pleural effusions. Electronically Signed   By: Steven  Olson M.D.   On: 09/30/2017 12:24    Cardiac Studies   LV EF: 55% -   60%  ------------------------------------------------------------------- History:   PMH:  Elevated Troponin.  Atrial fibrillation.  Risk factors:  Hypertension. Diabetes mellitus. Dyslipidemia.  ------------------------------------------------------------------- Study Conclusions  - Left ventricle: The cavity size was normal. Systolic function was   normal. The estimated ejection fraction was in the range of 55%   to 60%. Wall motion was normal; there were no regional wall   motion abnormalities. Left ventricular diastolic function    parameters were normal. - Aortic valve: Possibly bicuspid; moderately thickened, mildly   calcified leaflets. Severe focal calcification involving the   posterior cusp. There was severe stenosis. There was mild to   moderate regurgitation directed centrally in the LVOT. Valve area   (VTI): 1.02 cm^2. Valve area (Vmax): 1.01 cm^2. Valve area   (Vmean): 1 cm^2. - Tricuspid valve: There was mild-moderate regurgitation. - Pulmonary arteries: Systolic pressure was mildly increased. PA   peak pressure: 45 mm Hg (S).  Assessment   Principal Problem:   Accidental overdose Active Problems:   Unconsciousness (HCC)   Non-ST elevation (NSTEMI) myocardial infarction (HCC)   Aortic stenosis   Plan   1. Hemodynamically stable. No chest pain. Plan for LHC today for elevated troponin and severe aortic stenosis, preoperative work-up. I again covered the risks, benefits and alternatives of cath with him and feel the risk/benefit ratio is favorable for him to proceed. He agrees and has consented to the procedure.   Time Spent Directly with Patient:  I have spent a total of 15 minutes with the patient reviewing hospital notes, telemetry, EKGs, labs and examining the patient as well as establishing an assessment and plan that was discussed personally with the patient.  > 50% of time was spent in direct patient care.  Length of Stay:  LOS: 2 days   Jennye Runquist C. Noami Bove, MD, FACC, FACP  [place]  [facility]  Medical Director of the [facility] Diplomate of the American Board of Clinical Lipidology Attending Cardiologist  Direct Dial: [phone]  Fax: [phone]  Website:  [email or web address]  Langston Summerfield C Lanorris Kalisz 10/02/2017, 9:08 AM  

## 2017-10-03 ENCOUNTER — Other Ambulatory Visit: Payer: Self-pay | Admitting: *Deleted

## 2017-10-03 ENCOUNTER — Inpatient Hospital Stay (HOSPITAL_COMMUNITY): Payer: Self-pay

## 2017-10-03 ENCOUNTER — Encounter (HOSPITAL_COMMUNITY): Payer: Self-pay

## 2017-10-03 ENCOUNTER — Encounter (HOSPITAL_COMMUNITY): Payer: Self-pay | Admitting: Cardiovascular Disease

## 2017-10-03 DIAGNOSIS — I350 Nonrheumatic aortic (valve) stenosis: Secondary | ICD-10-CM

## 2017-10-03 DIAGNOSIS — T40601A Poisoning by unspecified narcotics, accidental (unintentional), initial encounter: Secondary | ICD-10-CM

## 2017-10-03 LAB — BASIC METABOLIC PANEL
ANION GAP: 6 (ref 5–15)
BUN: 12 mg/dL (ref 6–20)
CALCIUM: 8.7 mg/dL — AB (ref 8.9–10.3)
CO2: 26 mmol/L (ref 22–32)
Chloride: 110 mmol/L (ref 98–111)
Creatinine, Ser: 0.87 mg/dL (ref 0.61–1.24)
GFR calc non Af Amer: >60 mL/min (ref >60)
Glucose, Bld: 100 mg/dL — ABNORMAL HIGH (ref 70–99)
POTASSIUM: 3.8 mmol/L (ref 3.5–5.1)
Sodium: 142 mmol/L (ref 135–145)

## 2017-10-03 LAB — CBC
HEMATOCRIT: 37.7 % — AB (ref 39.0–52.0)
Hemoglobin: 12.5 g/dL — ABNORMAL LOW (ref 13.0–17.0)
MCH: 31.4 pg (ref 26.0–34.0)
MCHC: 33.2 g/dL (ref 30.0–36.0)
MCV: 94.7 fL (ref 78.0–100.0)
Platelets: 242 10*3/uL (ref 150–400)
RBC: 3.98 MIL/uL — AB (ref 4.22–5.81)
RDW: 13.4 % (ref 11.5–15.5)
WBC: 9.3 10*3/uL (ref 4.0–10.5)

## 2017-10-03 MED ORDER — NICOTINE 21 MG/24HR TD PT24: 21.0000 mg | MEDICATED_PATCH | Freq: Every day | TRANSDERMAL | 0 refills | Status: AC

## 2017-10-03 NOTE — Progress Notes (Signed)
Progress Note  Patient Name: Austin Francis Date of Encounter: 10/03/2017  Primary Cardiologist: Chrystie Nose, MD   Subjective   No chest pain and no SOB, irritated to be awaken from sleep initially.   Inpatient Medications    Scheduled Meds: . aspirin EC  81 mg Oral Daily  . budesonide (PULMICORT) nebulizer solution  0.25 mg Nebulization BID  . folic acid  1 mg Oral Daily  . heparin  5,000 Units Subcutaneous Q8H  . Influenza vac split quadrivalent PF  0.5 mL Intramuscular Tomorrow-1000  . LORazepam  0-4 mg Intravenous Q12H  . multivitamin with minerals  1 tablet Oral Daily  . nicotine  21 mg Transdermal Daily  . sodium chloride flush  3 mL Intravenous Q12H  . thiamine  100 mg Oral Daily   Or  . thiamine  100 mg Intravenous Daily   Continuous Infusions: . sodium chloride     PRN Meds: sodium chloride, acetaminophen, ipratropium-albuterol, sodium chloride flush   Vital Signs    Vitals:   10/02/17 1637 10/02/17 2016 10/02/17 2143 10/03/17 0500  BP: 119/87 111/84  119/88  Pulse: 84 89  65  Resp: 16 18  18   Temp:  98.9 F (37.2 C)  (!) 97.5 F (36.4 C)  TempSrc:  Oral  Oral  SpO2:  98% 99% 97%  Weight:      Height:        Intake/Output Summary (Last 24 hours) at 10/03/2017 0857 Last data filed at 10/03/2017 0000 Gross per 24 hour  Intake 541.25 ml  Output 650 ml  Net -108.75 ml   Filed Weights   09/30/17 0023 09/30/17 1612 10/01/17 1946  Weight: 59 kg 57.8 kg 55.2 kg    Telemetry    SR - Personally Reviewed  ECG    No new - Personally Reviewed  Physical Exam   GEN: No acute distress.   Neck: No JVD, Bil bruits Cardiac: RRR, 3-4/6 systolic murmur, no rubs, or gallops. Cath site stable no hematoma Respiratory: Clear to auscultation bilaterally. GI: Soft, nontender, non-distended  MS: No edema; No deformity. Neuro:  Nonfocal  Psych: Normal affect   Labs    Chemistry Recent Labs  Lab 09/30/17 0035  10/01/17 0259 10/02/17 0316  10/03/17 0439  NA 137  --  139 143 142  K 3.3*  --  3.1* 3.6 3.8  CL 101  --  107 110 110  CO2 23  --  23 26 26   GLUCOSE 256*  --  117* 105* 100*  BUN 8  --  5* <5* 12  CREATININE 1.56*   < > 0.94 0.81 0.87  CALCIUM 8.9  --  8.6* 8.8* 8.7*  PROT 6.2*  --   --  5.4*  --   ALBUMIN 3.9  --   --  3.1*  --   AST 25  --   --  16  --   ALT 16  --   --  14  --   ALKPHOS 93  --   --  64  --   BILITOT 0.5  --   --  0.7  --   GFRNONAA 51*   < > >60 >60 >60  GFRAA 59*   < > >60 >60 >60  ANIONGAP 13  --  9 7 6    < > = values in this interval not displayed.     Hematology Recent Labs  Lab 10/01/17 0259 10/02/17 0316 10/03/17 0439  WBC 15.9* 8.6 9.3  RBC 3.83* 3.87* 3.98*  HGB 12.3* 12.2* 12.5*  HCT 36.2* 36.6* 37.7*  MCV 94.5 94.6 94.7  MCH 32.1 31.5 31.4  MCHC 34.0 33.3 33.2  RDW 13.6 13.5 13.4  PLT 262 234 242    Cardiac Enzymes Recent Labs  Lab 09/30/17 1035 09/30/17 1639 09/30/17 1918 10/02/17 0316  TROPONINI 2.96* 1.98* 2.05* 0.81*    Recent Labs  Lab 09/30/17 0309  TROPIPOC 1.68*     BNPNo results for input(s): BNP, PROBNP in the last 168 hours.   DDimer No results for input(s): DDIMER in the last 168 hours.   Radiology    No results found.  Cardiac Studies   Cardiac cath 10/02/17 1. No angiographic evidence of CAD 2. Severe aortic stenosis (mean gradient 48.3 mmHg, peak to peak gradient 64 mmHg, AVA 0.62 cm2).   Recommendations: CT surgery consult for AVR. Consider CTA chest to exclude aortic pathology given bicuspid aortic valve.   Cardiac cath 09/30/17 Study Conclusions  - Left ventricle: The cavity size was normal. Systolic function was   normal. The estimated ejection fraction was in the range of 55%   to 60%. Wall motion was normal; there were no regional wall   motion abnormalities. Left ventricular diastolic function   parameters were normal. - Aortic valve: Possibly bicuspid; moderately thickened, mildly   calcified leaflets. Severe  focal calcification involving the   posterior cusp. There was severe stenosis. There was mild to   moderate regurgitation directed centrally in the LVOT. Valve area   (VTI): 1.02 cm^2. Valve area (Vmax): 1.01 cm^2. Valve area   (Vmean): 1 cm^2. - Tricuspid valve: There was mild-moderate regurgitation. - Pulmonary arteries: Systolic pressure was mildly increased. PA   peak pressure: 45 mm Hg (S).   Patient Profile     47 y.o. male with opiod overdose and associated hypoxic respiratory arrest, revived with narcan and found to have elevated troponin, no CAD on cath but severe AS.    Assessment & Plan    Severe AS - on cath -- for dental eval today --d/c tomorrow and follow up with Dr. Cornelius Moraswen next week and CTA of chest next week.     Elevated troponin but normal coronary arteries and no RWMA on echo.  May be due to severe AS with accidental overdose, hypoxic respiratory arrest.   AKI resolved Cr today 0.87   Accidental overdose per IM       For questions or updates, please contact [facility] Please consult [email or web address] for contact info under        Signed, Nada BoozerLaura Coreon Simkins, NP  10/03/2017, 8:57 AM

## 2017-10-03 NOTE — Discharge Summary (Addendum)
Physician Discharge Summary  Austin Francis RUE:[id number] DOB: November 29, 1970 DOA: 09/30/2017  PCP: Patient, No Pcp Per  Admit date: 09/30/2017 Discharge date: 10/03/2017  Admitted From: Home  Disposition:  Home   Recommendations for Outpatient Follow-up and new medication changes:  1. Follow up with Dr Austin Francis, cardiothoracic surgery as scheduled  2. Will need outpatient CT angiography chest, abdomen and pelvis 3. Patient has been discharged on a nicotine patch, continue smoking cessation.   Home Health: no   Equipment/Devices: no    Discharge Condition: stable  CODE STATUS: full  Diet recommendation: Regular  Brief/Interim Summary: 47 year old male who presented with altered mentation.  He does have the significant past medical history for COPD and tobacco abuse.  Apparently patient combined cannabis and opiates, developing acute loss of consciousness, requiring naloxone.  On his initial physical examination blood pressure 108/85, heart rate 85, respiratory rate 17, oxygen saturation 99%.  Moist mucous membranes, lungs with expiratory wheezing, heart S1-S2 present rhythmic, systolic murmur at the base, 3 out of 6, abdomen soft nontender, lower extremity edema.  Sodium 137, potassium 3.3, chloride 101, bicarb 23, glucose 256, BUN 8, creatinine 1.56, troponin I 1.56, white count 29.7, hemoglobin 15.6, hematocrit 46.7, platelets 391.  Urinalysis specific gravity 1.016, white cells 6-10, RBCs 6-10, alcohol level less than 10, urine drug screen positive for tetrahydrocannabinol.  Chest radiograph negative for infiltrates.  EKG sinus tachycardia 145 bmp, with inferior lateral ST depressions.  Patient was admitted to the hospital with the working diagnosis of toxic encephalopathy complicated by elevated troponins.  1.  Toxic encephalopathy.  Patient was admitted to the medical unit, he had frequent neuro checks. Responded well to naloxone.  2.  Type II myocardial infarction/demand ischemia/NSTEMI.   Patient was empirically placed on aspirin, he underwent further work-up with cardiac catheterization and echocardiography.  His ejection fraction was 55 to 60%, no wall motion normalities.  His aortic valve had severe stenosis.  Angiography with no evidence of coronary artery disease.   3.  Severe aortic stenosis.  Echocardiographic measurement of the aortic valve area was 1.0 cm.  Per cardiac catheterization the mean gradient was 48.3 mmHg, peak to peak gradient 64 mmHg, aortic valve area 0.62 cm.  Patient had a preop dental evaluation with an orthopantogram and will follow as outpatient with cardiothoracic surgery for CT angiography, chest, abdomen and pelvis  4.  Pre-renal acute kidney injury.  Patient received IV fluids with improvement of his kidney function, he did well contrast exposure.  Discharge creatinine 0.87, potassium 3.7, serum bicarb 26.  5.  Tobacco abuse.  Cessation, patient received a nicotine patch while hospitalized.  May need outpatient PFTs as part of preop pulmonary evaluation.  Discharge Diagnoses:  Principal Problem:   Accidental overdose Active Problems:   Unconsciousness (HCC)   Non-ST elevation (NSTEMI) myocardial infarction [facility])   Aortic stenosis   AKI (acute kidney injury) (HCC)   Elevated troponin    Discharge Instructions   Allergies as of 10/03/2017   No Known Allergies     Medication List    TAKE these medications   aspirin-acetaminophen-caffeine 250-250-65 MG tablet Commonly known as:  EXCEDRIN MIGRAINE Take 1-2 tablets by mouth every 6 (six) hours as needed (for migraines).   ibuprofen 200 MG tablet Commonly known as:  ADVIL,MOTRIN Take 200-600 mg by mouth every 6 (six) hours as needed (for migraines).   nicotine 21 mg/24hr patch Commonly known as:  NICODERM CQ - dosed in mg/24 hours Place 1 patch (21 mg total)  onto the skin daily. Start taking on:  10/04/2017      Follow-up Information    Austin Francis, [name], NP Follow up on  10/16/2017.   Specialty:  Nurse Practitioner Why:  10:00 for hospital follow up and establish pcp. please bring photo id, proof of income, and please confirm your apt 24 hrs before coming. Contact information: [address] [place] [address] [phone]          No Known Allergies  Consultations:  Cardiology   CT surgery    Procedures/Studies: [place] Renal  Result Date: 09/30/2017 CLINICAL DATA:  47 year old male with acute kidney insufficiency. Initial encounter. EXAM: RENAL / URINARY TRACT ULTRASOUND COMPLETE COMPARISON:  None. FINDINGS: Right Kidney: Length: 11.6 cm. Echogenicity within normal limits. No mass or hydronephrosis visualized. Left Kidney: Length: 11.4 cm. Echogenicity within normal limits. No mass or hydronephrosis visualized. Bladder: Appears normal for degree of bladder distention. Accessory splenic tissue suspected. Small bilateral pleural effusions. IMPRESSION: 1. No hydronephrosis. 2. Small bilateral pleural effusions. Electronically Signed   By: Lacy DuverneySteven  Olson M.D.   On: 09/30/2017 12:24   Dg Chest Port 1 View  Result Date: 09/30/2017 CLINICAL DATA:  Code sepsis EXAM: PORTABLE CHEST 1 VIEW COMPARISON:  Portable exam 0253 hours without priors for comparison FINDINGS: Normal heart size, mediastinal contours, and pulmonary vascularity. Lungs emphysematous with minimal subsegmental atelectasis at LEFT base. Remaining lungs clear. No pleural effusion or pneumothorax. Diffuse osseous demineralization. IMPRESSION: Emphysematous changes with subsegmental atelectasis at LEFT base. Electronically Signed   By: Ulyses SouthwardMark  Boles M.D.   On: 09/30/2017 03:02       Subjective: Patient is feeling well. No nausea or vomiting, no chest pain or dyspnea.   Discharge Exam: Vitals:   10/03/17 0500 10/03/17 1007  BP: 119/88   Pulse: 65 83  Resp: 18 18  Temp: (!) 97.5 F (36.4 C)   SpO2: 97% 95%   Vitals:   10/02/17 2016 10/02/17 2143 10/03/17 0500 10/03/17 1007  BP: 111/84   119/88   Pulse: 89  65 83  Resp: 18  18 18   Temp: 98.9 F (37.2 C)  (!) 97.5 F (36.4 C)   TempSrc: Oral  Oral   SpO2: 98% 99% 97% 95%  Weight:      Height:        General: Not in pain or dyspnea.  Neurology: Awake and alert, non focal  E ENT: no pallor, no icterus, oral mucosa moist Cardiovascular: No JVD. S1-S2 present, rhythmic, no gallops, or rubs, positive systolic murmurs at the right sternal border, 2nd intercostal sapce. No lower extremity edema. Pulmonary: vesicular breath sounds bilaterally, adequate air movement, no wheezing, rhonchi or rales. Gastrointestinal. Abdomen with no organomegaly, non tender, no rebound or guarding Skin. No rashes Musculoskeletal: no joint deformities   The results of significant diagnostics from this hospitalization (including imaging, microbiology, ancillary and laboratory) are listed below for reference.     Microbiology: Recent Results (from the past 240 hour(s))  Blood Culture (routine x 2)     Status: None (Preliminary result)   Collection Time: 09/30/17  2:50 AM  Result Value Ref Range Status   Specimen Description BLOOD LEFT ARM  Final   Special Requests   Final    BOTTLES DRAWN AEROBIC AND ANAEROBIC Blood Culture adequate volume   Culture   Final    NO GROWTH 3 DAYS Performed at [facility], [address] [place], [place] [address]    Report Status PENDING  Incomplete  Blood Culture (routine x 2)     Status: None (Preliminary result)   Collection Time: 09/30/17  3:04 AM  Result Value Ref Range Status   Specimen Description BLOOD RIGHT WRIST  Final   Special Requests   Final    BOTTLES DRAWN AEROBIC AND ANAEROBIC Blood Culture adequate volume   Culture   Final    NO GROWTH 3 DAYS Performed at [facility], [address] [place], [place] [address]    Report Status PENDING  Incomplete     Labs: BNP (last 3 results) No results for input(s): BNP in the last 8760 hours. Basic Metabolic Panel: Recent Labs   Lab 09/30/17 0035 09/30/17 1221 10/01/17 0259 10/02/17 0316 10/03/17 0439  NA 137  --  139 143 142  K 3.3*  --  3.1* 3.6 3.8  CL 101  --  107 110 110  CO2 23  --  23 26 26   GLUCOSE 256*  --  117* 105* 100*  BUN 8  --  5* <5* 12  CREATININE 1.56* 1.13 0.94 0.81 0.87  CALCIUM 8.9  --  8.6* 8.8* 8.7*  MG  --  1.5*  --  1.5*  --   PHOS  --  3.5  --   --   --    Liver Function Tests: Recent Labs  Lab 09/30/17 0035 10/02/17 0316  AST 25 16  ALT 16 14  ALKPHOS 93 64  BILITOT 0.5 0.7  PROT 6.2* 5.4*  ALBUMIN 3.9 3.1*   No results for input(s): LIPASE, AMYLASE in the last 168 hours. No results for input(s): AMMONIA in the last 168 hours. CBC: Recent Labs  Lab 09/30/17 0035 09/30/17 1639 10/01/17 0259 10/02/17 0316 10/03/17 0439  WBC 29.7* 16.6* 15.9* 8.6 9.3  NEUTROABS 26.7*  --   --  5.3  --   HGB 15.6 13.3 12.3* 12.2* 12.5*  HCT 46.7 40.9 36.2* 36.6* 37.7*  MCV 95.3 98.3 94.5 94.6 94.7  PLT 391 277 262 234 242   Cardiac Enzymes: Recent Labs  Lab 09/30/17 0356 09/30/17 1035 09/30/17 1221 09/30/17 1639 09/30/17 1918 10/02/17 0316  CKTOTAL  --   --  199  --   --   --   TROPONINI 1.56* 2.96*  --  1.98* 2.05* 0.81*   BNP: Invalid input(s): POCBNP CBG: No results for input(s): GLUCAP in the last 168 hours. D-Dimer No results for input(s): DDIMER in the last 72 hours. Hgb A1c No results for input(s): HGBA1C in the last 72 hours. Lipid Profile No results for input(s): CHOL, HDL, LDLCALC, TRIG, CHOLHDL, LDLDIRECT in the last 72 hours. Thyroid function studies No results for input(s): TSH, T4TOTAL, T3FREE, THYROIDAB in the last 72 hours.  Invalid input(s): FREET3 Anemia work up No results for input(s): VITAMINB12, FOLATE, FERRITIN, TIBC, IRON, RETICCTPCT in the last 72 hours. Urinalysis    Component Value Date/Time   COLORURINE YELLOW 09/30/2017 0115   APPEARANCEUR HAZY (A) 09/30/2017 0115   LABSPEC 1.016 09/30/2017 0115   PHURINE 7.0 09/30/2017 0115    GLUCOSEU 50 (A) 09/30/2017 0115   HGBUR NEGATIVE 09/30/2017 0115   BILIRUBINUR NEGATIVE 09/30/2017 0115   KETONESUR NEGATIVE 09/30/2017 0115   PROTEINUR 100 (A) 09/30/2017 0115   NITRITE NEGATIVE 09/30/2017 0115   LEUKOCYTESUR NEGATIVE 09/30/2017 0115   Sepsis Labs Invalid input(s): PROCALCITONIN,  WBC,  LACTICIDVEN Microbiology Recent Results (from the past 240 hour(s))  Blood Culture (routine x 2)     Status: None (Preliminary result)  Collection Time: 09/30/17  2:50 AM  Result Value Ref Range Status   Specimen Description BLOOD LEFT ARM  Final   Special Requests   Final    BOTTLES DRAWN AEROBIC AND ANAEROBIC Blood Culture adequate volume   Culture   Final    NO GROWTH 3 DAYS Performed at [facility], [address] [place], [place] [address]    Report Status PENDING  Incomplete  Blood Culture (routine x 2)     Status: None (Preliminary result)   Collection Time: 09/30/17  3:04 AM  Result Value Ref Range Status   Specimen Description BLOOD RIGHT WRIST  Final   Special Requests   Final    BOTTLES DRAWN AEROBIC AND ANAEROBIC Blood Culture adequate volume   Culture   Final    NO GROWTH 3 DAYS Performed at [facility], [address], [place], [place] [address]    Report Status PENDING  Incomplete     Time coordinating discharge: 45 minutes  SIGNED:   Coralie Keens, MD  [facility] 10/03/2017, 1:53 PM Pager [phone]  If 7PM-7AM, please contact night-coverage [email or web address] Password [email or web address]

## 2017-10-03 NOTE — Plan of Care (Signed)
  Problem: Clinical Measurements: Goal: Respiratory complications will improve Outcome: Progressing Note:  No s/s of respiratory complications noted.   Problem: Coping: Goal: Level of anxiety will decrease Outcome: Progressing Note:  No s/s of anxiety noted.   

## 2017-10-03 NOTE — Consult Note (Addendum)
DENTAL CONSULTATION  Date of Consultation:  10/03/2017 Patient Name:   Austin Francis Date of Birth:   1971-01-17 Medical Record Number: [id number]  VITALS: BP 119/88 (BP Location: Left Arm)   Pulse 65   Temp (!) 97.5 F (36.4 C) (Oral)   Resp 18   Ht 5\' 10"  (1.778 m)   Wt 55.2 kg   SpO2 97%   BMI 17.48 kg/m   CHIEF COMPLAINT: Patient referred by Dr. Rennis GoldenHilty for dental consultation.  HPI: Austin ShoresLeonard Francis is a 47 year old male recently diagnosed with severe aortic stenosis.  Patient with anticipated aortic valve replacement in the future with Dr. Cornelius Moraswen.  Patient is now seen as part of a pre-heart valve surgery dental protocol examination rule out dental infection that may affect the patient systemic health and anticipated heart valve surgery.  Patient indicates that he is edentulous.  Patient has been edentulous for the past 20 years.  The patient denies having any mouth pain.  Patient indicates his last dentures were made approximately 20 years ago at [facility].  The patient indicates  that he does not wear his dentures.  Patient indicates that he is able to chew anything including ice and nuts with his gums.  Patient denies having dental phobia.  PROBLEM LIST: Patient Active Problem List   Diagnosis Date Noted  . AKI (acute kidney injury) (HCC)   . Elevated troponin   . Non-ST elevation (NSTEMI) myocardial infarction (HCC) 10/01/2017  . Aortic stenosis 10/01/2017  . Accidental overdose 09/30/2017  . Unconsciousness (HCC) 09/30/2017    PMH: History reviewed. No pertinent past medical history.  PSH: Past Surgical History:  Procedure Laterality Date  . RIGHT/LEFT HEART CATH AND CORONARY ANGIOGRAPHY N/A 10/02/2017   Procedure: RIGHT/LEFT HEART CATH AND CORONARY ANGIOGRAPHY;  Surgeon: Kathleene HazelMcAlhany, Christopher D, MD;  Location: [facility] INVASIVE CV LAB;  Service: Cardiovascular;  Laterality: N/A;  . WISDOM TOOTH EXTRACTION      ALLERGIES: No Known  Allergies  MEDICATIONS: Current Facility-Administered Medications  Medication Dose Route Frequency Provider Last Rate Last Dose  . 0.9 %  sodium chloride infusion  250 mL Intravenous PRN Kathleene HazelMcAlhany, Christopher D, MD      . acetaminophen (TYLENOL) tablet 650 mg  650 mg Oral Q6H PRN Kathleene HazelMcAlhany, Christopher D, MD   650 mg at 10/02/17 2157  . aspirin EC tablet 81 mg  81 mg Oral Daily Verne CarrowMcAlhany, Christopher D, MD      . budesonide (PULMICORT) nebulizer solution 0.25 mg  0.25 mg Nebulization BID Kathleene HazelMcAlhany, Christopher D, MD   0.25 mg at 10/02/17 2143  . folic acid (FOLVITE) tablet 1 mg  1 mg Oral Daily Kathleene HazelMcAlhany, Christopher D, MD   1 mg at 10/02/17 0858  . heparin injection 5,000 Units  5,000 Units Subcutaneous Q8H Kathleene HazelMcAlhany, Christopher D, MD      . Influenza vac split quadrivalent PF (FLUARIX) injection 0.5 mL  0.5 mL Intramuscular Tomorrow-1000 Verne CarrowMcAlhany, Christopher D, MD      . ipratropium-albuterol (DUONEB) 0.5-2.5 (3) MG/3ML nebulizer solution 3 mL  3 mL Nebulization Q6H PRN Kathleene HazelMcAlhany, Christopher D, MD      . LORazepam (ATIVAN) injection 0-4 mg  0-4 mg Intravenous Q12H Kathleene HazelMcAlhany, Christopher D, MD      . multivitamin with minerals tablet 1 tablet  1 tablet Oral Daily Kathleene HazelMcAlhany, Christopher D, MD   1 tablet at 10/02/17 0858  . nicotine (NICODERM CQ - dosed in mg/24 hours) patch 21 mg  21 mg Transdermal Daily Kathleene HazelMcAlhany, Christopher D, MD  21 mg at 10/02/17 0903  . sodium chloride flush (NS) 0.9 % injection 3 mL  3 mL Intravenous Q12H Kathleene Hazel, MD   3 mL at 10/02/17 2157  . sodium chloride flush (NS) 0.9 % injection 3 mL  3 mL Intravenous PRN Kathleene Hazel, MD      . thiamine (VITAMIN B-1) tablet 100 mg  100 mg Oral Daily Kathleene Hazel, MD   100 mg at 10/02/17 0857   Or  . thiamine (B-1) injection 100 mg  100 mg Intravenous Daily Verne Carrow D, MD   100 mg at 10/01/17 0941    LABS: Lab Results  Component Value Date   WBC 9.3 10/03/2017   HGB 12.5 (L)  10/03/2017   HCT 37.7 (L) 10/03/2017   MCV 94.7 10/03/2017   PLT 242 10/03/2017      Component Value Date/Time   NA 142 10/03/2017 0439   K 3.8 10/03/2017 0439   CL 110 10/03/2017 0439   CO2 26 10/03/2017 0439   GLUCOSE 100 (H) 10/03/2017 0439   BUN 12 10/03/2017 0439   CREATININE 0.87 10/03/2017 0439   CALCIUM 8.7 (L) 10/03/2017 0439   GFRNONAA >60 10/03/2017 0439   GFRAA >60 10/03/2017 0439   Lab Results  Component Value Date   INR 1.13 10/02/2017   No results found for: PTT  SOCIAL HISTORY: Social History   Socioeconomic History  . Marital status: Single    Spouse name: Not on file  . Number of children: Not on file  . Years of education: Not on file  . Highest education level: Not on file  Occupational History  . Not on file  Social Needs  . Financial resource strain: Not on file  . Food insecurity:    Worry: Not on file    Inability: Not on file  . Transportation needs:    Medical: Not on file    Non-medical: Not on file  Tobacco Use  . Smoking status: Current Every Day Smoker    Packs/day: 1.00    Types: Cigarettes  . Smokeless tobacco: Never Used  Substance and Sexual Activity  . Alcohol use: Yes  . Drug use: Yes    Comment: recreation percocet  . Sexual activity: Not on file  Lifestyle  . Physical activity:    Days per week: Not on file    Minutes per session: Not on file  . Stress: Not on file  Relationships  . Social connections:    Talks on phone: Not on file    Gets together: Not on file    Attends religious service: Not on file    Active member of club or organization: Not on file    Attends meetings of clubs or organizations: Not on file    Relationship status: Not on file  . Intimate partner violence:    Fear of current or ex partner: Not on file    Emotionally abused: Not on file    Physically abused: Not on file    Forced sexual activity: Not on file  Other Topics Concern  . Not on file  Social History Narrative  . Not on file     FAMILY HISTORY: History reviewed. No pertinent family history.  REVIEW OF SYSTEMS: Reviewed with the patient as per History of present illness. Psych: Patient denies having dental phobia.  DENTAL HISTORY: CHIEF COMPLAINT: Patient referred by Dr. Rennis Golden for dental consultation.  HPI: Austin Francis is a 47 year old male recently diagnosed with  severe aortic stenosis.  Patient with anticipated aortic valve replacement in the future with Dr. Cornelius Moras.  Patient is now seen as part of a pre-heart valve surgery dental protocol examination rule out dental infection that may affect the patient systemic health and anticipated heart valve surgery.  Patient indicates that he is edentulous.  Patient has been edentulous for the past 20 years.  The patient denies having any mouth pain.  Patient indicates his last dentures were made approximately 20 years ago at [facility].  The patient indicates  that he does not wear his dentures.  Patient indicates that he is able to chew anything including ice and nuts with his gums.  Patient denies having dental phobia.  DENTAL EXAMINATION: GENERAL: The patient is a well-developed, well-nourished male in no acute distress. HEAD AND NECK: There is no palpable neck lymphadenopathy.  The patient denies acute TMJ symptoms. INTRAORAL EXAM: Patient has normal saliva.  There is no evidence of denture irritation or abscess formation within the mouth. DENTITION: Patient is edentulous.  The patient does have atrophy of the edentulous alveolar ridges. PROSTHODONTIC: Patient no longer wears dentures although they were fabricated previously. OCCLUSION: I am unable to assess the occlusion at this time.  RADIOGRAPHIC INTERPRETATION: Orthopantogram was taken on 10/03/2017. The patient is edentulous.There is no evidence of impacted teeth.  ASSESSMENTS: 1.  Severe aortic stenosis 2.  Pre-heart valve surgery dental protocol 3.  Patient is edentulous 4.  There is atrophy  of the edentulous alveolar ridges. 5.  Patient does not wear dentures.   PLAN/RECOMMENDATIONS: 1. I discussed the risks, benefits, and complications of various treatment options with the patient in relationship to his medical and dental conditions, anticipated heart valve surgery, and risk for endocarditis. An orthopantogram was obtained and there is no evidence of impacted teeth or other pathology.  We also discussed various treatment options to include no treatment,  implant therapy, and replacement of missing teeth as indicated. The patient indicates that he is not interested in any other dental treatment at this time.   2. Discussion of findings with medical team and coordination of future medical and dental care as needed.   Charlynne Pander, DDS

## 2017-10-05 LAB — CULTURE, BLOOD (ROUTINE X 2)
CULTURE: NO GROWTH
Culture: NO GROWTH
Special Requests: ADEQUATE
Special Requests: ADEQUATE

## 2017-10-09 ENCOUNTER — Telehealth: Payer: Self-pay

## 2017-10-09 NOTE — Telephone Encounter (Signed)
Patient called asking if he should take an 81mg  ASA after discharge from the hospital after his cardiac catheterization.  I advised patient that our office would not stop aspirin for a new patient consultation.  However, if he was confused as to whether or not he should be taking ASA, I advised him to contact his Cardiologist for a conformation.  He acknowledged receipt.  Also confirmed his appointment with Dr. Cornelius Moraswen 10/28/2017.

## 2017-10-23 ENCOUNTER — Ambulatory Visit (HOSPITAL_COMMUNITY)
Admission: RE | Admit: 2017-10-23 | Discharge: 2017-10-23 | Disposition: A | Discharge status: Home / Self Care | Payer: Self-pay | Source: Ambulatory Visit | Attending: Thoracic Surgery (Cardiothoracic Vascular Surgery) | Admitting: Thoracic Surgery (Cardiothoracic Vascular Surgery)

## 2017-10-23 ENCOUNTER — Other Ambulatory Visit (HOSPITAL_COMMUNITY): Payer: Self-pay

## 2017-10-23 DIAGNOSIS — I350 Nonrheumatic aortic (valve) stenosis: Secondary | ICD-10-CM

## 2017-10-23 DIAGNOSIS — I700 Atherosclerosis of aorta: Secondary | ICD-10-CM | POA: Insufficient documentation

## 2017-10-23 DIAGNOSIS — J439 Emphysema, unspecified: Secondary | ICD-10-CM | POA: Insufficient documentation

## 2017-10-23 MED ORDER — IOPAMIDOL (ISOVUE-370) INJECTION 76%
100.0000 mL | Freq: Once | INTRAVENOUS | Status: AC | PRN
  Administered 2017-10-23: 100 mL via INTRAVENOUS

## 2017-10-28 ENCOUNTER — Encounter: Payer: Self-pay | Admitting: Thoracic Surgery (Cardiothoracic Vascular Surgery)

## 2017-10-28 ENCOUNTER — Other Ambulatory Visit: Payer: Self-pay

## 2017-10-28 ENCOUNTER — Institutional Professional Consult (permissible substitution) (INDEPENDENT_AMBULATORY_CARE_PROVIDER_SITE_OTHER): Payer: Self-pay | Admitting: Thoracic Surgery (Cardiothoracic Vascular Surgery)

## 2017-10-28 ENCOUNTER — Other Ambulatory Visit: Payer: Self-pay | Admitting: *Deleted

## 2017-10-28 VITALS — BP 98/70 | HR 79 | Resp 18 | Ht 70.0 in | Wt 124.6 lb

## 2017-10-28 DIAGNOSIS — I350 Nonrheumatic aortic (valve) stenosis: Secondary | ICD-10-CM

## 2017-10-28 NOTE — Patient Instructions (Addendum)
Continue all previous medications without any changes at this time  Stop smoking immediately and permanently.  

## 2017-10-28 NOTE — Progress Notes (Addendum)
[facility]  MULTIDISCIPLINARY HEART VALVE CLINIC  CARDIOTHORACIC SURGERY CONSULTATION REPORT  Referring Provider is Hilty, Lisette Abu, MD PCP is Evie Lacks, FNP  Chief Complaint  Patient presents with  . New Patient (Initial Visit)    new patient consultaion, Cardiac Gated CT with chest/abd/pelvis 10/23/2017, ECHO and CATH 9/12  . Aortic Stenosis    HPI:  Patient is a 47 year old male with history of polysubstance abuse including long-standing tobacco abuse, marijuana use, and chronic opioid use who has been referred for surgical consultation to discuss recently discovered bicuspid aortic valve with stage D severe symptomatic aortic stenosis.  Patient states that he has been smoking cigarettes for more than 30 years and smoking marijuana off and on for nearly 20 years.  More recently he has used oral narcotic pain relievers, and occasionally he admits to grinding them up and snorting.  He explicitly denies any history of IV drug use of any kind.  He denies any previous history of heart murmur and he is otherwise relatively healthy and without any previously known medical conditions.  Patient was recently hospitalized September 10 through September 13 at which time he presented via EMS with altered mental status.  He had been smoking marijuana and admitted to taking 2 or 3 oral narcotic pain pills.  He lost consciousness and EMS was called.  He was administered naloxone and regained consciousness in route to the emergency department.  Urine toxicology screen was positive for THC but negative for opiates.  He was noted to have a heart murmur on physical exam and EKG revealed sinus tachycardia with diffuse lateral ST segment depression.  Troponin levels was mildly elevated.  Cardiology consultation was obtained and transthoracic echocardiogram revealed likely bicuspid aortic valve with severe aortic stenosis.  Diagnostic cardiac catheterization was performed and confirmed the  presence of severe aortic stenosis.  There was no significant coronary artery disease and right-sided pressures were normal.  Patient recovered uneventfully and was discharged from the hospital.  Cardiothoracic surgical consultation was requested.  The patient is separated from his estranged wife and lives locally with his mother.  He has been out of work for several years.  He reports that he tries to do some jobs with friends of his.  Prior to his hospital presentation he states that he had no significant limitations.  He notes that he does get tired easily and he reports some shortness of breath with more strenuous physical exertion.  He reports occasional dizzy spells when he stands up.  He has not had any syncopal episodes other than that which brought him to the hospital recently.  History reviewed. No pertinent past medical history.  Past Surgical History:  Procedure Laterality Date  . RIGHT/LEFT HEART CATH AND CORONARY ANGIOGRAPHY N/A 10/02/2017   Procedure: RIGHT/LEFT HEART CATH AND CORONARY ANGIOGRAPHY;  Surgeon: Kathleene Hazel, MD;  Location: [facility];  Service: Cardiovascular;  Laterality: N/A;  . WISDOM TOOTH EXTRACTION      History reviewed. No pertinent family history.  Social History   Socioeconomic History  . Marital status: Single    Spouse name: Not on file  . Number of children: Not on file  . Years of education: Not on file  . Highest education level: Not on file  Occupational History  . Not on file  Social Needs  . Financial resource strain: Not on file  . Food insecurity:    Worry: Not on file    Inability: Not on file  .  Transportation needs:    Medical: Not on file    Non-medical: Not on file  Tobacco Use  . Smoking status: Current Every Day Smoker    Packs/day: 1.00    Types: Cigarettes  . Smokeless tobacco: Never Used  Substance and Sexual Activity  . Alcohol use: Yes  . Drug use: Yes    Comment: recreation percocet  . Sexual  activity: Not on file  Lifestyle  . Physical activity:    Days per week: Not on file    Minutes per session: Not on file  . Stress: Not on file  Relationships  . Social connections:    Talks on phone: Not on file    Gets together: Not on file    Attends religious service: Not on file    Active member of club or organization: Not on file    Attends meetings of clubs or organizations: Not on file    Relationship status: Not on file  . Intimate partner violence:    Fear of current or ex partner: Not on file    Emotionally abused: Not on file    Physically abused: Not on file    Forced sexual activity: Not on file  Other Topics Concern  . Not on file  Social History Narrative  . Not on file    Current Outpatient Medications  Medication Sig Dispense Refill  . aspirin-acetaminophen-caffeine (EXCEDRIN MIGRAINE) 250-250-65 MG tablet Take 1-2 tablets by mouth every 6 (six) hours as needed (for migraines).     Marland Kitchen ibuprofen (ADVIL,MOTRIN) 200 MG tablet Take 200-600 mg by mouth every 6 (six) hours as needed (for migraines).     . Multiple Vitamin (MULTIVITAMIN) tablet Take 1 tablet by mouth daily.    . nicotine (NICODERM CQ - DOSED IN MG/24 HOURS) 21 mg/24hr patch Place 1 patch (21 mg total) onto the skin daily. 28 patch 0  . Pseudoephedrine-Ibuprofen 30-200 MG TABS Take 2 tablets by mouth 2 (two) times daily as needed.     No current facility-administered medications for this visit.     No Known Allergies    Review of Systems:   General:  normal appetite, decreased energy, no weight gain, no weight loss, no fever  Cardiac:  no chest pain with exertion, no chest pain at rest, + SOB with strenuous exertion, no resting SOB, no PND, no orthopnea, no palpitations, no arrhythmia, no atrial fibrillation, no LE edema, + dizzy spells, + syncope  Respiratory:  no shortness of breath, no home oxygen, no productive cough, no dry cough, no bronchitis, no wheezing, no hemoptysis, no asthma, no pain  with inspiration or cough, no sleep apnea, no CPAP at night  GI:   no difficulty swallowing, no reflux, no frequent heartburn, no hiatal hernia, no abdominal pain, no constipation, no diarrhea, no hematochezia, no hematemesis, no melena  GU:   no dysuria,  no frequency, no urinary tract infection, no hematuria, no enlarged prostate, no kidney stones, no kidney disease  Vascular:  no pain suggestive of claudication, no pain in feet, no leg cramps, no varicose veins, no DVT, no non-healing foot ulcer  Neuro:   no stroke, no TIA's, no seizures, + migraine headaches, no temporary blindness one eye,  no slurred speech, no peripheral neuropathy, no chronic pain, no instability of gait, no memory/cognitive dysfunction  Musculoskeletal: no arthritis, no joint swelling, no myalgias, no difficulty walking, normal mobility   Skin:   no rash, no itching, no skin infections, no pressure sores or  ulcerations  Psych:   no anxiety, no depression, no nervousness, no unusual recent stress  Eyes:   no blurry vision, no floaters, no recent vision changes, does not wears glasses or contacts  ENT:   no hearing loss, edentulous,  no dentures, last saw dentist during his recent hospitalization  Hematologic:  no easy bruising, no abnormal bleeding, no clotting disorder, no frequent epistaxis  Endocrine:  no diabetes, does not check CBG's at home           Physical Exam:   BP 98/70 (BP Location: Left Arm, Patient Position: Sitting, Cuff Size: Normal)   Pulse 79   Resp 18   Ht 5\' 10"  (1.778 m)   Wt 124 lb 9.6 oz (56.5 kg)   SpO2 97% Comment: RA  BMI 17.88 kg/m   General:  Thin,  well-appearing  HEENT:  Unremarkable   Neck:   no JVD, no bruits, no adenopathy   Chest:   clear to auscultation, symmetrical breath sounds, no wheezes, no rhonchi   CV:   RRR, grade III/VI crescendo/decrescendo murmur heard best at RSB,  no diastolic murmur  Abdomen:  soft, non-tender, no masses   Extremities:  warm, well-perfused,  pulses palpable, no LE edema  Rectal/GU  Deferred  Neuro:   Grossly non-focal and symmetrical throughout  Skin:   Clean and dry, no rashes, no breakdown   Diagnostic Tests:  Transthoracic Echocardiography  Patient:    Austin Francis, Austin Francis MR #:       [id number] Study Date: 09/30/2017 Gender:     M Age:        76 Height:     177.8 cm Weight:     59 kg BSA:        1.69 m^2 Pt. Status: Room:   ATTENDING    Barnetta Chapel  PERFORMING   [facility], In[facility]  ORDERING     Opyd, Timothy S  REFERRING    Opyd, Timothy S  SONOGRAPHER  Dance, Tiffany  cc:  ------------------------------------------------------------------- LV EF: 55% -   60%  ------------------------------------------------------------------- History:   PMH:  Elevated Troponin.  Atrial fibrillation.  Risk factors:  Hypertension. Diabetes mellitus. Dyslipidemia.  ------------------------------------------------------------------- Study Conclusions  - Left ventricle: The cavity size was normal. Systolic function was   normal. The estimated ejection fraction was in the range of 55%   to 60%. Wall motion was normal; there were no regional wall   motion abnormalities. Left ventricular diastolic function   parameters were normal. - Aortic valve: Possibly bicuspid; moderately thickened, mildly   calcified leaflets. Severe focal calcification involving the   posterior cusp. There was severe stenosis. There was mild to   moderate regurgitation directed centrally in the LVOT. Valve area   (VTI): 1.02 cm^2. Valve area (Vmax): 1.01 cm^2. Valve area   (Vmean): 1 cm^2. - Tricuspid valve: There was mild-moderate regurgitation. - Pulmonary arteries: Systolic pressure was mildly increased. PA   peak pressure: 45 mm Hg (S).  ------------------------------------------------------------------- Study data:  No prior study was available for comparison.  Study status:  Routine.  Procedure:  The patient reported no pain pre  or post test. Transthoracic echocardiography. Image quality was adequate.  Study completion:  There were no complications. Transthoracic echocardiography.  M-mode, complete 2D, spectral Doppler, and color Doppler.  Birthdate:  Patient birthdate: 1970/05/31.  Age:  Patient is 47 yr old.  Sex:  Gender: male. BMI: 18.7 kg/m^2.  Blood pressure:     104/82  Patient status: Inpatient.  Study date:  Study date: 09/30/2017. Study time: 11:08 AM.  Location:  Emergency department.  -------------------------------------------------------------------  ------------------------------------------------------------------- Left ventricle:  The cavity size was normal. Systolic function was normal. The estimated ejection fraction was in the range of 55% to 60%. Wall motion was normal; there were no regional wall motion abnormalities. The transmitral flow pattern was normal. Left ventricular diastolic function parameters were normal. There was no evidence of elevated ventricular filling pressure by Doppler parameters.  ------------------------------------------------------------------- Aortic valve:   Possibly bicuspid; moderately thickened, mildly calcified leaflets.  Severe focal calcification involving the posterior cusp. Mobility was not restricted.  Doppler:   There was severe stenosis.   There was mild to moderate regurgitation directed centrally in the LVOT.    VTI ratio of LVOT to aortic valve: 0.25. Valve area (VTI): 1.02 cm^2. Indexed valve area (VTI): 0.6 cm^2/m^2. Peak velocity ratio of LVOT to aortic valve: 0.25. Valve area (Vmax): 1.01 cm^2. Indexed valve area (Vmax): 0.59 cm^2/m^2. Mean velocity ratio of LVOT to aortic valve: 0.24. Valve area (Vmean): 1 cm^2. Indexed valve area (Vmean): 0.59 cm^2/m^2. Mean gradient (S): 38 mm Hg. Peak gradient (S): 61 mm Hg.  ------------------------------------------------------------------- Aorta:  Aortic root: The aortic root was normal in  size.  ------------------------------------------------------------------- Mitral valve:   Mildly thickened leaflets . Mobility was not restricted.  Doppler:  Transvalvular velocity was within the normal range. There was no evidence for stenosis. There was no regurgitation.    Peak gradient (D): 4 mm Hg.  ------------------------------------------------------------------- Left atrium:  The atrium was normal in size.  ------------------------------------------------------------------- Right ventricle:  The cavity size was normal. Wall thickness was normal. Systolic function was normal.  ------------------------------------------------------------------- Pulmonic valve:   Poorly visualized.  Doppler:  Transvalvular velocity was within the normal range. There was no evidence for stenosis.  ------------------------------------------------------------------- Tricuspid valve:   Structurally normal valve.    Doppler: Transvalvular velocity was within the normal range. There was mild-moderate regurgitation.  ------------------------------------------------------------------- Pulmonary artery:   The main pulmonary artery was normal-sized. Systolic pressure was mildly increased.  ------------------------------------------------------------------- Right atrium:  The atrium was normal in size.  ------------------------------------------------------------------- Pericardium:  There was no pericardial effusion.  ------------------------------------------------------------------- Systemic veins: Inferior vena cava: The vessel was normal in size.  ------------------------------------------------------------------- Measurements   Left ventricle                           Value          Reference  LV ID, ED, PLAX chordal          (L)     37    mm       43 - 52  LV ID, ES, PLAX chordal                  29.3  mm       23 - 38  LV fx shortening, PLAX chordal   (L)     21    %         >=29  LV PW thickness, ED                      8.57  mm       ----------  IVS/LV PW ratio, ED                      1.12           <=1.3  Stroke volume, 2D  61    ml       ----------  Stroke volume/bsa, 2D                    36    ml/m^2   ----------  LV e&', lateral                           10.8  cm/s     ----------  LV E/e&', lateral                         9.07           ----------  LV e&', medial                            8.49  cm/s     ----------  LV E/e&', medial                          11.54          ----------  LV e&', average                           9.65  cm/s     ----------  LV E/e&', average                         10.16          ----------    Ventricular septum                       Value          Reference  IVS thickness, ED                        9.6   mm       ----------    LVOT                                     Value          Reference  LVOT ID, S                               22.8  mm       ----------  LVOT area                                4.08  cm^2     ----------  LVOT peak velocity, S                    96.4  cm/s     ----------  LVOT mean velocity, S                    70.1  cm/s     ----------  LVOT VTI, S                              21.5  cm       ----------  Stroke volume (  SV), LVOT DP              87.8  ml       ----------  Stroke index (SV/bsa), LVOT DP           51.8  ml/m^2   ----------    Aortic valve                             Value          Reference  Aortic valve peak velocity, S            391   cm/s     ----------  Aortic valve mean velocity, S            287   cm/s     ----------  Aortic valve VTI, S                      85.8  cm       ----------  Aortic mean gradient, S                  38    mm Hg    ----------  Aortic peak gradient, S                  61    mm Hg    ----------  VTI ratio, LVOT/AV                       0.25           ----------  Aortic valve area, VTI                   1.02  cm^2     ----------   Aortic valve area/bsa, VTI               0.6   cm^2/m^2 ----------  Velocity ratio, peak, LVOT/AV            0.25           ----------  Aortic valve area, peak velocity         1.01  cm^2     ----------  Aortic valve area/bsa, peak              0.59  cm^2/m^2 ----------  velocity  Velocity ratio, mean, LVOT/AV            0.24           ----------  Aortic valve area, mean velocity         1     cm^2     ----------  Aortic valve area/bsa, mean              0.59  cm^2/m^2 ----------  velocity  Aortic regurg pressure half-time         514   ms       ----------    Aorta                                    Value          Reference  Aortic root ID, ED                       34    mm       ----------  Left atrium                              Value          Reference  LA ID, A-P, ES                           31    mm       ----------  LA ID/bsa, A-P                           1.83  cm/m^2   <=2.2  LA volume, S                             48.5  ml       ----------  LA volume/bsa, S                         28.6  ml/m^2   ----------  LA volume, ES, 1-p A4C                   30.6  ml       ----------  LA volume/bsa, ES, 1-p A4C               18.1  ml/m^2   ----------  LA volume, ES, 1-p A2C                   62.6  ml       ----------  LA volume/bsa, ES, 1-p A2C               36.9  ml/m^2   ----------    Mitral valve                             Value          Reference  Mitral E-wave peak velocity              98    cm/s     ----------  Mitral A-wave peak velocity              72.3  cm/s     ----------  Mitral deceleration time                 222   ms       150 - 230  Mitral peak gradient, D                  4     mm Hg    ----------  Mitral E/A ratio, peak                   1.4            ----------    Pulmonary arteries                       Value          Reference  PA pressure, S, DP               (H)     45    mm Hg    <=30    Tricuspid valve  Value           Reference  Tricuspid regurg peak velocity           273   cm/s     ----------  Tricuspid peak RV-RA gradient            30    mm Hg    ----------    Right atrium                             Value          Reference  RA ID, S-I, ES, A4C                      36    mm       34 - 49  RA area, ES, A4C                         9.48  cm^2     8.3 - 19.5  RA volume, ES, A/L                       20.7  ml       ----------  RA volume/bsa, ES, A/L                   12.2  ml/m^2   ----------    Systemic veins                           Value          Reference  Estimated CVP                            15    mm Hg    ----------    Right ventricle                          Value          Reference  RV ID, minor axis, ED, A4C base          21.6  mm       ----------  TAPSE                                    17.8  mm       ----------  RV pressure, S, DP               (H)     45    mm Hg    <=30  RV s&', lateral, S                        10    cm/s     ----------  Legend: (L)  and  (H)  mark values outside specified reference range.  ------------------------------------------------------------------- Prepared and Electronically Authenticated by  Thurmon Fair, MD 2019-09-10T13:48:00   RIGHT/LEFT HEART CATH AND CORONARY ANGIOGRAPHY  Conclusion   1. No angiographic evidence of CAD 2. Severe aortic stenosis (mean gradient 48.3 mmHg, peak to peak gradient 64 mmHg, AVA 0.62 cm2).   Recommendations: CT surgery consult for AVR. Consider CTA chest to exclude aortic pathology given bicuspid aortic  valve.   Indications   Severe aortic stenosis [I35.0 (ICD-10-CM)]  Procedural Details/Technique   Technical Details Indication: 47 yo male with bicuspid AV, severe AS admitted post drug overdose and found to have severe AV disease.   Procedure: The risks, benefits, complications, treatment options, and expected outcomes were discussed with the patient. The patient and/or family concurred with the proposed  plan, giving informed consent. The patient was brought to the cath lab after IV hydration was given. The patient was sedated with Versed and Fentanyl. The IV catheter present in the right antecubital vein was changed for a 5 [place] sheath. Right heart catheterization performed with a balloon tipped catheter. The right wrist was prepped and draped in a sterile fashion. 1% lidocaine was used for local anesthesia. Using the modified Seldinger access technique, a 5 French sheath was placed in the right radial artery. 3 mg Verapamil was given through the sheath. 3000 units IV heparin was given. Standard diagnostic catheters were used to perform selective coronary angiography. An AL-1 catheter was used to engage the Circumflex artery. (There was no left main. The LAD and Circumflex had different ostia). I crossed the aortic valve with an AL-1 catheter and measured LV pressures. No LV gram. The sheath was removed from the right radial artery and a Terumo hemostasis band was applied at the arteriotomy site on the right wrist.     Estimated blood loss <50 mL.  During this procedure the patient was administered the following to achieve and maintain moderate conscious sedation: Versed 2 mg, Fentanyl 50 mcg, while the patient's heart rate, blood pressure, and oxygen saturation were continuously monitored. The period of conscious sedation was 49 minutes, of which I was present face-to-face 100% of this time.  Complications   Complications documented before study signed (10/02/2017 4:38 PM EDT)    RIGHT/LEFT HEART CATH AND CORONARY ANGIOGRAPHY   None Documented by Kathleene Hazel, MD 10/02/2017 4:08 PM EDT  Time Range: Intraprocedure      Coronary Findings   Diagnostic  Dominance: Left  Left Anterior Descending  Vessel is large.  Left Circumflex  First Obtuse Marginal Branch  Vessel is large in size.  Right Coronary Artery  Vessel is moderate in size.  Intervention   No interventions have  been documented.  Coronary Diagrams   Diagnostic Diagram       Implants    No implant documentation for this case.  MERGE Images   Show images for CARDIAC CATHETERIZATION   Link to Procedure Log   Procedure Log    Hemo Data    Most Recent Value  Fick Cardiac Output 5.01 L/min  Fick Cardiac Output Index 2.97 (L/min)/BSA  Aortic Mean Gradient 48.35 mmHg  Aortic Peak Gradient 64 mmHg  Aortic Valve Area 0.62  Aortic Value Area Index 0.37 cm2/BSA  RA A Wave 5 mmHg  RA V Wave 5 mmHg  RA Mean 3 mmHg  RV Systolic Pressure 26 mmHg  RV Diastolic Pressure 0 mmHg  RV EDP 4 mmHg  PA Systolic Pressure 28 mmHg  PA Diastolic Pressure 8 mmHg  PA Mean 18 mmHg  PW A Wave 19 mmHg  PW V Wave 16 mmHg  PW Mean 14 mmHg  AO Systolic Pressure 111 mmHg  AO Diastolic Pressure 80 mmHg  AO Mean 94 mmHg  LV Systolic Pressure 176 mmHg  LV Diastolic Pressure 6 mmHg  LV EDP 15 mmHg  AOp Systolic Pressure 109 mmHg  AOp Diastolic Pressure 72 mmHg  AOp Mean  Pressure 89 mmHg  LVp Systolic Pressure 173 mmHg  LVp Diastolic Pressure 5 mmHg  LVp EDP Pressure 15 mmHg  QP/QS 1  TPVR Index 6.06 HRUI  TSVR Index 31.66 HRUI  PVR SVR Ratio 0.04  TPVR/TSVR Ratio 0.19    Cardiac TAVR CT  TECHNIQUE: The patient was scanned on a [employer]. A 120 kV retrospective scan was triggered in the descending thoracic aorta at 111 HU's. Gantry rotation speed was 250 msecs and collimation was .6 mm. No beta blockade or nitro were given. The 3D data set was reconstructed in 5% intervals of the R-R cycle. Systolic and diastolic phases were analyzed on a dedicated work station using MPR, MIP and VRT modes. The patient received 80 cc of contrast.  FINDINGS: Aortic Valve: The aortic valve is functionally bicuspid with non-separated left and non-coronary cusps. Leaflets are severely thickened and only mildly to moderately calcified, leaflet excursion is severely restricted.  Aorta: Ascending  aorta appears aneurysmal but the maximum diameter is 38 mm. Trivial calcifications, no obvious atheroma, no dissection.  Sinotubular Junction: 28 x 27 mm  Ascending Thoracic Aorta: 38 x 36 mm  Aortic Arch: 23 x 23 mm  Descending Thoracic Aorta: 21 x 21 mm  Sinus of Valsalva Measurements:  Non-coronary: 36 mm  Right -coronary: 33 mm  Left -coronary: 35 mm  Coronary Artery Height above Annulus:  Left Main: 10.5 mm  Right Coronary: 24 mm  Virtual Basal Annulus Measurements:  Maximum/Minimum Diameter: 28.6 x 24.2 mm  Mean Diameter: 25.7 mm  Perimeter: 81.9 mm  Area: 519 mm2  IMPRESSION: 1. The aortic valve is functionally bicuspid with non-separated left and non-coronary cusps. Leaflets are severely thickened and only mildly to moderately calcified, leaflet excursion is severely restricted.  2. Ascending aorta appears aneurysmal but the maximum diameter is 38 mm. Trivial calcifications, no obvious atheroma, no dissection.  3. Sufficient coronary to annulus distance.  4. No thrombus in the left atrial appendage.  5.  No ASD or VSD is seen.   Electronically Signed   By: Tobias Alexander   On: 10/24/2017 19:57   CT ANGIOGRAPHY CHEST, ABDOMEN AND PELVIS  TECHNIQUE: Multidetector CT imaging through the chest, abdomen and pelvis was performed using the standard protocol during bolus administration of intravenous contrast. Multiplanar reconstructed images and MIPs were obtained and reviewed to evaluate the vascular anatomy.  CONTRAST:  95 cc ISOVUE-370 IOPAMIDOL (ISOVUE-370) INJECTION 76%  COMPARISON:  09/30/2017 renal sonogram and chest radiograph.  FINDINGS: CTA CHEST FINDINGS  Cardiovascular: Normal heart size. No significant pericardial effusion/thickening. Severe thickening and calcification of aortic valve. Mildly atherosclerotic nonaneurysmal thoracic aorta (ascending thoracic aortic diameter 3.7 cm). Normal  caliber pulmonary arteries. No central pulmonary emboli.  Mediastinum/Nodes: No discrete thyroid nodules. Unremarkable esophagus. No pathologically enlarged axillary, mediastinal or hilar lymph nodes.  Lungs/Pleura: No pneumothorax. No pleural effusion. Mild centrilobular and paraseptal emphysema with diffuse bronchial wall thickening. Mildly thick curvilinear parenchymal band in the dependent basilar right lower lobe, compatible with scarring or atelectasis. No acute consolidative airspace disease, lung masses or significant pulmonary nodules.  Musculoskeletal:  No aggressive appearing focal osseous lesions.  CTA ABDOMEN AND PELVIS FINDINGS  Hepatobiliary: Normal liver size. Two subcentimeter hypodense superior right liver lesions are too small to characterize and require no follow-up unless the patient has risk factors for liver malignancy. No additional liver lesions. Normal gallbladder with no radiopaque cholelithiasis. No biliary ductal dilatation.  Pancreas: Normal, with no mass or duct dilation.  Spleen: Normal  size. No mass.  Adrenals/Urinary Tract: Normal adrenals. Normal kidneys with no hydronephrosis and no renal mass. Normal bladder.  Stomach/Bowel: Normal non-distended stomach. Normal caliber small bowel with no small bowel wall thickening. Normal appendix. Normal large bowel with no diverticulosis, large bowel wall thickening or pericolonic fat stranding.  Vascular/Lymphatic: Atherosclerotic nonaneurysmal abdominal aorta. Patent portal, splenic and renal veins. No pathologically enlarged lymph nodes in the abdomen or pelvis.  Reproductive: Top-normal size prostate. Nonspecific internal prostatic calcifications.  Other: No pneumoperitoneum, ascites or focal fluid collection.  Musculoskeletal: No aggressive appearing focal osseous lesions. Mild lumbar spondylosis.  VASCULAR MEASUREMENTS PERTINENT TO TAVR:  AORTA:  Minimal Aortic  Diameter-10.6 x 9.1 mm (infrarenal abdominal aorta on series 6/image 138)  Severity of Aortic Calcification-moderate  RIGHT PELVIS:  Right Common Iliac Artery -  Minimal Diameter-5.5 x 5.1 mm  Tortuosity-mild  Calcification-moderate (noting 1.6 cm ectatic right common iliac artery with circumferential mural thrombus)  Right External Iliac Artery -  Minimal Diameter-5.6 x 5.4 mm  Tortuosity-mild  Calcification-none  Right Common Femoral Artery -  Minimal Diameter-6.2 x 6.0 mm  Tortuosity-mild-to-moderate  Calcification-none  LEFT PELVIS:  Left Common Iliac Artery -  Minimal Diameter-6.7 x 6.5 mm  Tortuosity-mild  Calcification-mild  Left External Iliac Artery -  Minimal Diameter-5.5 x 5.4 mm  Tortuosity-mild  Calcification-none  Left Common Femoral Artery -  Minimal Diameter-6.3 x 6.1 mm  Tortuosity-mild-to-moderate  Calcification-none  Review of the MIP images confirms the above findings.  IMPRESSION: 1. Vascular findings and measurements pertinent to potential TAVR procedure, as detailed above, noting somewhat diminutive iliac arteries bilaterally. 2. Severe thickening and calcification of the aortic valve, compatible with the reported clinical history of severe aortic stenosis. 3. Aortic Atherosclerosis (ICD10-I70.0) and Emphysema (ICD10-J43.9).   Electronically Signed   By: Delbert Phenix M.D.   On: 10/24/2017 10:23    Impression:  Patient has a Sievers type I bicuspid aortic valve with stage D severe symptomatic aortic stenosis.  He describes very mild symptoms of exertional shortness of breath and fatigue consistent with chronic diastolic congestive heart failure, New York Heart Association functional class I.  He also reports occasional dizzy spells, and he was recently hospitalized following a syncopal event that occurred in the setting of drug use.  At the time of his hospital presentation urine  toxicology screen was positive for THC but negative for opioids.  The patient explicitly denies any history of intravenous drug use at any time in the past.  I have personally reviewed the patient's recent transthoracic echocardiogram, diagnostic cardiac catheterization, and CT angiogram.  Echocardiogram documents presence of a functionally bicuspid aortic valve with severe aortic stenosis.  Peak velocity across aortic valve measured 3.9 m/s corresponding to mean transvalvular gradient estimated 38 mmHg.  The DVI was reported 0.24.  There was mild aortic insufficiency.  There is normal left ventricular systolic function.  No other significant abnormalities are noted.  Diagnostic cardiac catheterization confirmed the presence of severe aortic stenosis with peak to peak and mean transvalvular gradients measured 64 and 48.3 mmHg, respectively.  The patient does not have significant coronary artery disease.  Right heart pressures were not elevated.  CT angiography reveals very mild dilatation of the ascending thoracic aorta with maximum transverse diameter approximately 3.5 to 3.6 cm.  There are no contraindications to peripheral cannulation for surgery.  The patient does have a very mild pectus excavatum deformity but this does not appear to be displacing left ventricular outflow tract or the aorta to any  significant degree.   Plan:  The patient and his mother counseled at length regarding treatment alternatives for management of severe aortic stenosis including continued medical therapy versus proceeding with aortic valve replacement in the near future.  The natural history of aortic stenosis was reviewed, as was long term prognosis with medical therapy alone.  Surgical options were discussed at length including conventional surgical aortic valve replacement through either a full median sternotomy or using minimally invasive techniques.   Discussion was held comparing the relative risks of mechanical valve  replacement with need for lifelong anticoagulation versus use of a bioprosthetic tissue valve and the associated potential for late structural valve deterioration and failure.  This discussion was placed in the context of the patient's particular circumstances.  Given the patient's history of polysubstance abuse and relative lack of access to consistent medical care, I would be reluctant to consider replacing his valve using a mechanical prosthetic valve.  Unfortunately, at the patient's relatively young age use of a bioprosthetic tissue valve will come with a significant risk of structural valve deterioration and failure during his lifetime.  However, I explained to the patient that his life expectancy will likely be relatively short if he cannot find a way to effectively recover from his problems with substance abuse, most notably chronic opioid use.  Patient and his mother expressed full understanding.  The patient hopes to proceed with surgery in the near future.  We tentatively plan to proceed with aortic valve replacement via right anterior minithoracotomy approach on November 20, 2017.  Expectations for his postoperative convalescence have been discussed.  The patient understands that because of his pectus excavatum deformity there is a somewhat higher risk that conversion to conventional sternotomy might be required.  All of his questions been addressed.  The patient will return to our office for follow-up prior to surgery on November 17, 2017.    I spent in excess of 90 minutes during the conduct of this office consultation and >50% of this time involved direct face-to-face encounter with the patient for counseling and/or coordination of their care.     Salvatore Decent. Cornelius Moras, MD 10/28/2017 12:36 PM

## 2017-10-29 ENCOUNTER — Encounter: Payer: Self-pay | Admitting: *Deleted

## 2017-11-14 NOTE — Pre-Procedure Instructions (Signed)
Austin Francis  11/14/2017      [id number] - [place], [place] - [address] AT [facility] [address] [place] [address] Phone: [phone] Fax: [phone]    Your procedure is scheduled on November 20, 2017.  Report to [facility] Admitting at 530 AM.  Call this number if you have problems the morning of surgery:  [phone]   Remember:  Do not eat or drink after midnight.    Take these medicines the morning of surgery with A SIP OF WATER -none  Beginning now, STOP taking any Aspirin (unless otherwise instructed by your surgeon), Aleve, Naproxen, Ibuprofen, Motrin, Advil, Goody's, BC's, all herbal medications, fish oil, and all vitamins   Do not wear jewelry  Do not wear lotions, powders, or colognes, or deodorant.  Men may shave face and neck.  Do not bring valuables to the hospital.  [facility] is not responsible for any belongings or valuables.  Contacts, dentures or bridgework may not be worn into surgery.  Leave your suitcase in the car.  After surgery it may be brought to your room.  For patients admitted to the hospital, discharge time will be determined by your treatment team.  Patients discharged the day of surgery will not be allowed to drive home.    [place]- Preparing For Surgery  Before surgery, you can play an important role. Because skin is not sterile, your skin needs to be as free of germs as possible. You can reduce the number of germs on your skin by washing with CHG (chlorahexidine gluconate) Soap before surgery.  CHG is an antiseptic cleaner which kills germs and bonds with the skin to continue killing germs even after washing.    Oral Hygiene is also important to reduce your risk of infection.  Remember - BRUSH YOUR TEETH THE MORNING OF SURGERY WITH YOUR REGULAR TOOTHPASTE  Please do not use if you have an allergy to CHG or antibacterial soaps. If your skin becomes reddened/irritated stop using  the CHG.  Do not shave (including legs and underarms) for at least 48 hours prior to first CHG shower. It is OK to shave your face.  Please follow these instructions carefully.   1. Shower the NIGHT BEFORE SURGERY and the MORNING OF SURGERY with CHG.   2. If you chose to wash your hair, wash your hair first as usual with your normal shampoo.  3. After you shampoo, rinse your hair and body thoroughly to remove the shampoo.  4. Use CHG as you would any other liquid soap. You can apply CHG directly to the skin and wash gently with a scrungie or a clean washcloth.   5. Apply the CHG Soap to your body ONLY FROM THE NECK DOWN.  Do not use on open wounds or open sores. Avoid contact with your eyes, ears, mouth and genitals (private parts). Wash Face and genitals (private parts)  with your normal soap.  6. Wash thoroughly, paying special attention to the area where your surgery will be performed.  7. Thoroughly rinse your body with warm water from the neck down.  8. DO NOT shower/wash with your normal soap after using and rinsing off the CHG Soap.  9. Pat yourself dry with a CLEAN TOWEL.  10. Wear CLEAN PAJAMAS to bed the night before surgery, wear comfortable clothes the morning of surgery  11. Place CLEAN SHEETS on your bed the night of your first shower and DO NOT SLEEP  WITH PETS.   Day of Surgery:  Do not apply any deodorants/lotions.  Please wear clean clothes to the hospital/surgery center.   Remember to brush your teeth WITH YOUR REGULAR TOOTHPASTE.  Please read over the following fact sheets that you were given. Pain Booklet, Coughing and Deep Breathing, MRSA Information and Surgical Site Infection Prevention

## 2017-11-17 ENCOUNTER — Ambulatory Visit (HOSPITAL_COMMUNITY)
Admission: RE | Admit: 2017-11-17 | Discharge: 2017-11-17 | Disposition: A | Discharge status: Home / Self Care | Payer: Self-pay | Source: Ambulatory Visit | Attending: Thoracic Surgery (Cardiothoracic Vascular Surgery) | Admitting: Thoracic Surgery (Cardiothoracic Vascular Surgery)

## 2017-11-17 ENCOUNTER — Other Ambulatory Visit: Payer: Self-pay | Admitting: *Deleted

## 2017-11-17 ENCOUNTER — Other Ambulatory Visit: Payer: Self-pay

## 2017-11-17 ENCOUNTER — Ambulatory Visit (INDEPENDENT_AMBULATORY_CARE_PROVIDER_SITE_OTHER): Payer: Self-pay | Admitting: Thoracic Surgery (Cardiothoracic Vascular Surgery)

## 2017-11-17 ENCOUNTER — Encounter (HOSPITAL_COMMUNITY): Payer: Self-pay

## 2017-11-17 ENCOUNTER — Encounter (HOSPITAL_COMMUNITY): Payer: Self-pay | Admitting: Physician Assistant

## 2017-11-17 ENCOUNTER — Ambulatory Visit (HOSPITAL_BASED_OUTPATIENT_CLINIC_OR_DEPARTMENT_OTHER)
Admission: RE | Admit: 2017-11-17 | Discharge: 2017-11-17 | Disposition: A | Discharge status: Home / Self Care | Payer: Self-pay | Source: Ambulatory Visit | Attending: Thoracic Surgery (Cardiothoracic Vascular Surgery) | Admitting: Thoracic Surgery (Cardiothoracic Vascular Surgery)

## 2017-11-17 ENCOUNTER — Encounter: Payer: Self-pay | Admitting: Thoracic Surgery (Cardiothoracic Vascular Surgery)

## 2017-11-17 ENCOUNTER — Encounter (HOSPITAL_COMMUNITY)
Admission: RE | Admit: 2017-11-17 | Discharge: 2017-11-17 | Disposition: A | Discharge status: Home / Self Care | Payer: Self-pay | Source: Ambulatory Visit | Attending: Thoracic Surgery (Cardiothoracic Vascular Surgery) | Admitting: Thoracic Surgery (Cardiothoracic Vascular Surgery)

## 2017-11-17 VITALS — BP 96/60 | HR 82 | Resp 16 | Ht 70.0 in | Wt 130.8 lb

## 2017-11-17 DIAGNOSIS — I350 Nonrheumatic aortic (valve) stenosis: Secondary | ICD-10-CM

## 2017-11-17 DIAGNOSIS — F17210 Nicotine dependence, cigarettes, uncomplicated: Secondary | ICD-10-CM | POA: Insufficient documentation

## 2017-11-17 DIAGNOSIS — R918 Other nonspecific abnormal finding of lung field: Secondary | ICD-10-CM | POA: Insufficient documentation

## 2017-11-17 DIAGNOSIS — Z01818 Encounter for other preprocedural examination: Secondary | ICD-10-CM | POA: Insufficient documentation

## 2017-11-17 DIAGNOSIS — J449 Chronic obstructive pulmonary disease, unspecified: Secondary | ICD-10-CM | POA: Insufficient documentation

## 2017-11-17 HISTORY — DX: Major depressive disorder, single episode, unspecified: F32.9

## 2017-11-17 HISTORY — DX: Anxiety disorder, unspecified: F41.9

## 2017-11-17 HISTORY — DX: Headache: R51

## 2017-11-17 HISTORY — DX: Depression, unspecified: F32.A

## 2017-11-17 HISTORY — DX: Nonrheumatic aortic (valve) stenosis: I35.0

## 2017-11-17 HISTORY — DX: Headache, unspecified: R51.9

## 2017-11-17 LAB — COMPREHENSIVE METABOLIC PANEL
ALT: 12 U/L (ref 0–44)
AST: 18 U/L (ref 15–41)
Albumin: 4.1 g/dL (ref 3.5–5.0)
Alkaline Phosphatase: 70 U/L (ref 38–126)
Anion gap: 7 (ref 5–15)
BILIRUBIN TOTAL: 0.4 mg/dL (ref 0.3–1.2)
BUN: 11 mg/dL (ref 6–20)
CHLORIDE: 105 mmol/L (ref 98–111)
CO2: 31 mmol/L (ref 22–32)
CREATININE: 1.01 mg/dL (ref 0.61–1.24)
Calcium: 9.6 mg/dL (ref 8.9–10.3)
Glucose, Bld: 81 mg/dL (ref 70–99)
POTASSIUM: 4.4 mmol/L (ref 3.5–5.1)
Sodium: 143 mmol/L (ref 135–145)
TOTAL PROTEIN: 6.8 g/dL (ref 6.5–8.1)

## 2017-11-17 LAB — BLOOD GAS, ARTERIAL
ACID-BASE EXCESS: 1.1 mmol/L (ref 0.0–2.0)
Bicarbonate: 25.5 mmol/L (ref 20.0–28.0)
Drawn by: 545251
FIO2: 21
O2 Saturation: 95.6 %
PCO2 ART: 43.1 mmHg (ref 32.0–48.0)
PH ART: 7.39 (ref 7.350–7.450)
PO2 ART: 75.7 mmHg — AB (ref 83.0–108.0)
Patient temperature: 98.6

## 2017-11-17 LAB — PULMONARY FUNCTION TEST
DL/VA % pred: 75 %
DL/VA: 3.52 ml/min/mmHg/L
DLCO unc % pred: 58 %
DLCO unc: 19.05 ml/min/mmHg
FEF 25-75 Post: 1.48 L/sec
FEF 25-75 Pre: 1.14 L/sec
FEF2575-%CHANGE-POST: 30 %
FEF2575-%PRED-PRE: 31 %
FEF2575-%Pred-Post: 40 %
FEV1-%CHANGE-POST: 7 %
FEV1-%PRED-POST: 56 %
FEV1-%PRED-PRE: 53 %
FEV1-PRE: 2.13 L
FEV1-Post: 2.29 L
FEV1FVC-%Change-Post: 4 %
FEV1FVC-%Pred-Pre: 74 %
FEV6-%Change-Post: 3 %
FEV6-%PRED-PRE: 70 %
FEV6-%Pred-Post: 72 %
FEV6-POST: 3.61 L
FEV6-PRE: 3.49 L
FEV6FVC-%Change-Post: 3 %
FEV6FVC-%PRED-PRE: 99 %
FEV6FVC-%Pred-Post: 102 %
FVC-%CHANGE-POST: 2 %
FVC-%PRED-PRE: 70 %
FVC-%Pred-Post: 72 %
FVC-POST: 3.72 L
FVC-PRE: 3.64 L
POST FEV6/FVC RATIO: 99 %
PRE FEV6/FVC RATIO: 96 %
Post FEV1/FVC ratio: 62 %
Pre FEV1/FVC ratio: 59 %
RV % PRED: 132 %
RV: 2.61 L
TLC % PRED: 90 %
TLC: 6.29 L

## 2017-11-17 LAB — URINALYSIS, ROUTINE W REFLEX MICROSCOPIC
Bilirubin Urine: NEGATIVE
Glucose, UA: NEGATIVE mg/dL
HGB URINE DIPSTICK: NEGATIVE
Ketones, ur: NEGATIVE mg/dL
LEUKOCYTES UA: NEGATIVE
Nitrite: NEGATIVE
Protein, ur: NEGATIVE mg/dL
SPECIFIC GRAVITY, URINE: 1.018 (ref 1.005–1.030)
pH: 6 (ref 5.0–8.0)

## 2017-11-17 LAB — CBC
HEMATOCRIT: 46.8 % (ref 39.0–52.0)
Hemoglobin: 14.8 g/dL (ref 13.0–17.0)
MCH: 30.9 pg (ref 26.0–34.0)
MCHC: 31.6 g/dL (ref 30.0–36.0)
MCV: 97.7 fL (ref 80.0–100.0)
Platelets: 244 10*3/uL (ref 150–400)
RBC: 4.79 MIL/uL (ref 4.22–5.81)
RDW: 13.5 % (ref 11.5–15.5)
WBC: 8.2 10*3/uL (ref 4.0–10.5)
nRBC: 0 % (ref 0.0–0.2)

## 2017-11-17 LAB — RAPID URINE DRUG SCREEN, HOSP PERFORMED
AMPHETAMINES: NOT DETECTED
BENZODIAZEPINES: NOT DETECTED
Barbiturates: NOT DETECTED
Cocaine: NOT DETECTED
OPIATES: POSITIVE — AB
Tetrahydrocannabinol: POSITIVE — AB

## 2017-11-17 LAB — TYPE AND SCREEN
ABO/RH(D): O POS
Antibody Screen: NEGATIVE

## 2017-11-17 LAB — PROTIME-INR
INR: 0.91
Prothrombin Time: 12.2 seconds (ref 11.4–15.2)

## 2017-11-17 LAB — HEMOGLOBIN A1C
HEMOGLOBIN A1C: 5.2 % (ref 4.8–5.6)
MEAN PLASMA GLUCOSE: 102.54 mg/dL

## 2017-11-17 LAB — SURGICAL PCR SCREEN
MRSA, PCR: NEGATIVE
Staphylococcus aureus: NEGATIVE

## 2017-11-17 LAB — ABO/RH: ABO/RH(D): O POS

## 2017-11-17 LAB — APTT: aPTT: 31 seconds (ref 24–36)

## 2017-11-17 MED ORDER — ALBUTEROL SULFATE (2.5 MG/3ML) 0.083% IN NEBU
2.5000 mg | INHALATION_SOLUTION | Freq: Once | RESPIRATORY_TRACT | Status: AC
  Administered 2017-11-17: 2.5 mg via RESPIRATORY_TRACT

## 2017-11-17 NOTE — Progress Notes (Signed)
Pre cardiac surgery prelim   Right Carotid:Velocities in the right ICA are consistent with a 1-39% stenosis.  Left Carotid: Velocities in the left ICA are consistent with a 1-39% stenosis.    Bilateral Extremity: Doppler waveforms remain within normal limits with compression bilaterally for the radial arteries. Doppler waveforms remain within normal limits with compression bilaterally for the ulnar arteries.    Farrel Demark, RDMS, RVT

## 2017-11-17 NOTE — Progress Notes (Signed)
[address]       [place] [address]             [phone]     CARDIOTHORACIC SURGERY OFFICE NOTE  Referring Provider is Hilty, Lisette Abu, MD PCP is Evie Lacks, FNP   HPI:  Patient is a 47 year old male with history of polysubstance abuse including long-standing tobacco abuse, marijuana use, and chronic opioid use who returns the office today for follow-up with tentative plans to proceed with elective aortic valve replacement later this week.  He was originally seen in consultation on October 28, 2017.  He reports no new problems or complaints.  He continues to smoke cigarettes but he claims that he has not smoked marijuana nor use any other illicit drugs.  He is anxious about his surgery but he desires to proceed as previously planned.   Current Outpatient Medications  Medication Sig Dispense Refill  . aspirin-acetaminophen-caffeine (EXCEDRIN MIGRAINE) 250-250-65 MG tablet Take 1-2 tablets by mouth every 6 (six) hours as needed (for migraines).     Marland Kitchen ibuprofen (ADVIL,MOTRIN) 200 MG tablet Take 200-600 mg by mouth every 6 (six) hours as needed (for migraines).     . Multiple Vitamin (MULTIVITAMIN) tablet Take 1 tablet by mouth daily.    . nicotine (NICODERM CQ - DOSED IN MG/24 HOURS) 21 mg/24hr patch Place 1 patch (21 mg total) onto the skin daily. (Patient not taking: Reported on 11/17/2017) 28 patch 0   No current facility-administered medications for this visit.       Physical Exam:   BP 96/60 (BP Location: Left Arm, Patient Position: Sitting, Cuff Size: Large)   Pulse 82   Resp 16   Ht 5\' 10"  (1.778 m)   Wt 130 lb 12.8 oz (59.3 kg)   SpO2 97% Comment: RA  BMI 18.77 kg/m   General:  Well-appearing  Chest:   Clear to auscultation  CV:   Regular rate and rhythm with prominent murmur  Incisions:  n/a  Abdomen:  Soft nontender  Extremities:  Warm and well-perfused  Diagnostic Tests:  n/a   Impression:  Patient has a Sievers type I  bicuspid aortic valve with stage D severe symptomatic aortic stenosis.  He describes very mild symptoms of exertional shortness of breath and fatigue consistent with chronic diastolic congestive heart failure, New York Heart Association functional class I.  He also reports occasional dizzy spells, and he was recently hospitalized following a syncopal event that occurred in the setting of drug use.  At the time of his hospital presentation urine toxicology screen was positive for THC but negative for opioids.  The patient explicitly denies any history of intravenous drug use at any time in the past.  I have personally reviewed the patient's recent transthoracic echocardiogram, diagnostic cardiac catheterization, and CT angiogram.  Echocardiogram documents presence of a functionally bicuspid aortic valve with severe aortic stenosis.  Peak velocity across aortic valve measured 3.9 m/s corresponding to mean transvalvular gradient estimated 38 mmHg.  The DVI was reported 0.24.  There was mild aortic insufficiency.  There is normal left ventricular systolic function.  No other significant abnormalities are noted.  Diagnostic cardiac catheterization confirmed the presence of severe aortic stenosis with peak to peak and mean transvalvular gradients measured 64 and 48.3 mmHg, respectively.  The patient does not have significant coronary artery disease.  Right heart pressures were not elevated.  CT angiography reveals very mild dilatation of the ascending thoracic aorta with maximum transverse  diameter approximately 3.5 to 3.6 cm.  There are no contraindications to peripheral cannulation for surgery.  The patient does have a very mild pectus excavatum deformity but this does not appear to be displacing left ventricular outflow tract or the aorta to any significant degree.   Plan:  The patient and his mother were again counseled at length regarding treatment alternatives for management of severe aortic stenosis  including continued medical therapy versus proceeding with aortic valve replacement.  Surgical options were discussed at length including conventional surgical aortic valve replacement through either a full median sternotomy or using minimally invasive techniques.   Discussion was held comparing the relative risks of mechanical valve replacement with need for lifelong anticoagulation versus use of a bioprosthetic tissue valve and the associated potential for late structural valve deterioration and failure.  This discussion was placed in the context of the patient's particular circumstances.  Given the patient's history of polysubstance abuse and relative lack of access to consistent medical care, I would be reluctant to consider replacing his valve using a mechanical prosthetic valve.  Unfortunately, at the patient's relatively young age use of a bioprosthetic tissue valve will come with a significant risk of structural valve deterioration and failure during his lifetime.  Patient and his mother expressed full understanding.  The patient hopes to proceed with surgery in the near future.  Expectations for his postoperative convalescence have been discussed.  The patient understands that because of his pectus excavatum deformity there is a somewhat higher risk that conversion to conventional sternotomy might be required.  All of his questions been addressed.    We plan to proceed with surgery on November 20, 2017.  The patient understands that he will undergo urine toxicology screen as part of his routine preoperative laboratory evaluation today.     I spent in excess of 15 minutes during the conduct of this office consultation and >50% of this time involved direct face-to-face encounter with the patient for counseling and/or coordination of their care.    Salvatore Decent. Cornelius Moras, MD 11/17/2017 9:50am

## 2017-11-17 NOTE — Progress Notes (Signed)
Anesthesia Chart Review:  Case:  [id number] Date/Time:  11/20/17 0715   Procedures:      MINIMALLY INVASIVE AORTIC VALVE REPLACEMENT (AVR) (N/A Chest)     TRANSESOPHAGEAL ECHOCARDIOGRAM (TEE) (N/A )   Anesthesia type:  General   Pre-op diagnosis:  AS   Location:  [facility] OR ROOM 15 / [facility] OR   Surgeon:  Purcell Nails, MD      DISCUSSION: 47 yo male current smoker. Pertinent hx includes COPD, Polysubstance abuse, Severe AS.  Pt was admitted 9/10-9/13/19 for accidental opiate overdose, toxic encephalopathy, demand ischemia MI, and AKI. Had cardiac catheterization and echocardiography.  His ejection fraction was 55 to 60%, no wall motion normalities.  His aortic valve had severe stenosis.  Angiography with no evidence of coronary artery disease. AVR recommended.  Dr. Cornelius Moras ordered UDS to be done at PAT. Results show positive for THC and opiates. Results sent to [facility].  Will need DOS eval by anesthesiologist. Anticipate he can proceed as planned barring acute status change.  VS: BP 105/74   Pulse 77   Temp (!) 36.4 C   Resp 16   Ht 5\' 10"  (1.778 m)   Wt 59.1 kg   SpO2 100%   BMI 18.68 kg/m   PROVIDERS: Patient, No Pcp Per   LABS: Labs reviewed: Acceptable for surgery. Darius Bump, RN made aware of results. (all labs ordered are listed, but only abnormal results are displayed)  Labs Reviewed  RAPID URINE DRUG SCREEN, HOSP PERFORMED - Abnormal; Notable for the following components:      Result Value   Opiates POSITIVE (*)    Tetrahydrocannabinol POSITIVE (*)    All other components within normal limits  SURGICAL PCR SCREEN  APTT  CBC  COMPREHENSIVE METABOLIC PANEL  HEMOGLOBIN A1C  PROTIME-INR  URINALYSIS, ROUTINE W REFLEX MICROSCOPIC  BLOOD GAS, ARTERIAL  BLOOD GAS, ARTERIAL  TYPE AND SCREEN  ABO/RH     IMAGES: CT Angio Chest 11/17/2017:  IMPRESSION: 1. Vascular findings and measurements pertinent to potential TAVR procedure, as detailed above, noting somewhat  diminutive iliac arteries bilaterally. 2. Severe thickening and calcification of the aortic valve, compatible with the reported clinical history of severe aortic stenosis. 3. Aortic Atherosclerosis (ICD10-I70.0) and Emphysema (ICD10-J43.9).  EKG: 11/17/2017: Normal sinus rhythm. Rate 71. Minimal voltage criteria for LVH, may be normal variant  CV: Cath 10/02/2017: 1. No angiographic evidence of CAD 2. Severe aortic stenosis (mean gradient 48.3 mmHg, peak to peak gradient 64 mmHg, AVA 0.62 cm2).   TTE 09/30/2017: Study Conclusions  - Left ventricle: The cavity size was normal. Systolic function was   normal. The estimated ejection fraction was in the range of 55%   to 60%. Wall motion was normal; there were no regional wall   motion abnormalities. Left ventricular diastolic function   parameters were normal. - Aortic valve: Possibly bicuspid; moderately thickened, mildly   calcified leaflets. Severe focal calcification involving the   posterior cusp. There was severe stenosis. There was mild to   moderate regurgitation directed centrally in the LVOT. Valve area   (VTI): 1.02 cm^2. Valve area (Vmax): 1.01 cm^2. Valve area   (Vmean): 1 cm^2. - Tricuspid valve: There was mild-moderate regurgitation. - Pulmonary arteries: Systolic pressure was mildly increased. PA   peak pressure: 45 mm Hg (S).  Past Medical History:  Diagnosis Date  . Anxiety   . Aortic stenosis   . Depression   . Headache     Past Surgical History:  Procedure  Laterality Date  . CARDIAC CATHETERIZATION    . RIGHT/LEFT HEART CATH AND CORONARY ANGIOGRAPHY N/A 10/02/2017   Procedure: RIGHT/LEFT HEART CATH AND CORONARY ANGIOGRAPHY;  Surgeon: Kathleene Hazel, MD;  Location: [facility] INVASIVE CV LAB;  Service: Cardiovascular;  Laterality: N/A;  . WISDOM TOOTH EXTRACTION      MEDICATIONS: . aspirin-acetaminophen-caffeine (EXCEDRIN MIGRAINE) 250-250-65 MG tablet  . ibuprofen (ADVIL,MOTRIN) 200 MG tablet  .  Multiple Vitamin (MULTIVITAMIN) tablet  . nicotine (NICODERM CQ - DOSED IN MG/24 HOURS) 21 mg/24hr patch   No current facility-administered medications for this encounter.     Zannie Cove [facility] Short Stay Center/Anesthesiology Phone [phone] 11/17/2017 4:44 PM

## 2017-11-17 NOTE — Progress Notes (Signed)
This encounter was created in error - please disregard.

## 2017-11-17 NOTE — Anesthesia Preprocedure Evaluation (Deleted)
Anesthesia Evaluation    Airway        Dental   Pulmonary Current Smoker,           Cardiovascular      Neuro/Psych    GI/Hepatic   Endo/Other    Renal/GU      Musculoskeletal   Abdominal   Peds  Hematology   Anesthesia Other Findings   Reproductive/Obstetrics                             Anesthesia Physical Anesthesia Plan  ASA:   Anesthesia Plan:    Post-op Pain Management:    Induction:   PONV Risk Score and Plan:   Airway Management Planned:   Additional Equipment:   Intra-op Plan:   Post-operative Plan:   Informed Consent:   Plan Discussed with:   Anesthesia Plan Comments: (See PAT note written 11/17/2017 by Antionette Poles, PA-C )        Anesthesia Quick Evaluation

## 2017-11-17 NOTE — Patient Instructions (Signed)
   Have nothing to eat or drink after midnight the night before surgery.  On the morning of surgery do not take any medications.   

## 2017-11-18 ENCOUNTER — Telehealth: Payer: Self-pay | Admitting: Thoracic Surgery (Cardiothoracic Vascular Surgery)

## 2017-11-18 ENCOUNTER — Encounter: Payer: Self-pay | Admitting: Thoracic Surgery (Cardiothoracic Vascular Surgery)

## 2017-11-18 NOTE — Telephone Encounter (Signed)
I personally called patient to discuss results of his drug screen performed yesterday which was positive for both opiates and THC.  The patient admits to have used marijuana within the past 2 weeks.  He denied using any type of opiate product and wonders if perhaps 1 of his friends had "slipped something in a drink".  I explained to him that under the circumstances I feel that it be an appropriate to proceed with elective aortic valve replacement at this time.  I have offered to help establish the patient a referral for professional assistance with substance abuse including opioid addiction.  I have offered to see the patient again in the future once he has become drug-free and establish a plan for long-term abstinence.  He expressed complete understanding and desire to stop using drugs of all kinds.  Plan for surgery has been postponed indefinitely.  All questions answered.  Purcell Nails, MD 11/18/2017 5:18 PM

## 2017-11-19 ENCOUNTER — Other Ambulatory Visit: Payer: Self-pay | Admitting: *Deleted

## 2017-11-20 ENCOUNTER — Inpatient Hospital Stay (HOSPITAL_COMMUNITY)
Admission: RE | Admit: 2017-11-20 | Payer: Self-pay | Source: Ambulatory Visit | Admitting: Thoracic Surgery (Cardiothoracic Vascular Surgery)

## 2017-11-20 ENCOUNTER — Encounter (HOSPITAL_COMMUNITY): Admission: RE | Payer: Self-pay | Source: Ambulatory Visit

## 2017-11-20 SURGERY — REPLACEMENT, AORTIC VALVE, MINIMALLY INVASIVE
Anesthesia: General | Site: Chest

## 2017-11-26 ENCOUNTER — Telehealth: Payer: Self-pay

## 2017-11-26 NOTE — Telephone Encounter (Signed)
Mr. Tillett contacted the office this morning stating that he was having chest pressure like he had a cold or bronchitis starting.  He stated that he was coughing up greenish looking mucous.  He wanted to know what over the counter medications he could take so he would "not mess anything up with his heart".  I advised him that he could take any over the counter medications because he does not take any medications that would contraindicate some cold/ cough medications.  Nor does he have a history of high blood pressure causing him to stay away from certain medications.  The only medication I did mention taking was Mucinex.  I advised him that he needed to contact his PCP and get an appointment to be checked out, which he stated he was awaiting a return call from.  I also advised that if he was having any chest pain or shortness of breath that he needed to go to the nearest ED instead of waiting for an appointment.  He felt this was not cardiac related, but acknowledged receipt.

## 2020-06-22 IMAGING — CR DG CHEST 2V
2 series · 2 of 2 positions shown · non-contrast
Comparison: Chest x-ray dated September 30, 2017.

CLINICAL DATA: Preoperative study for TAVR.

EXAM:
CHEST - 2 VIEW

[w chest pa]
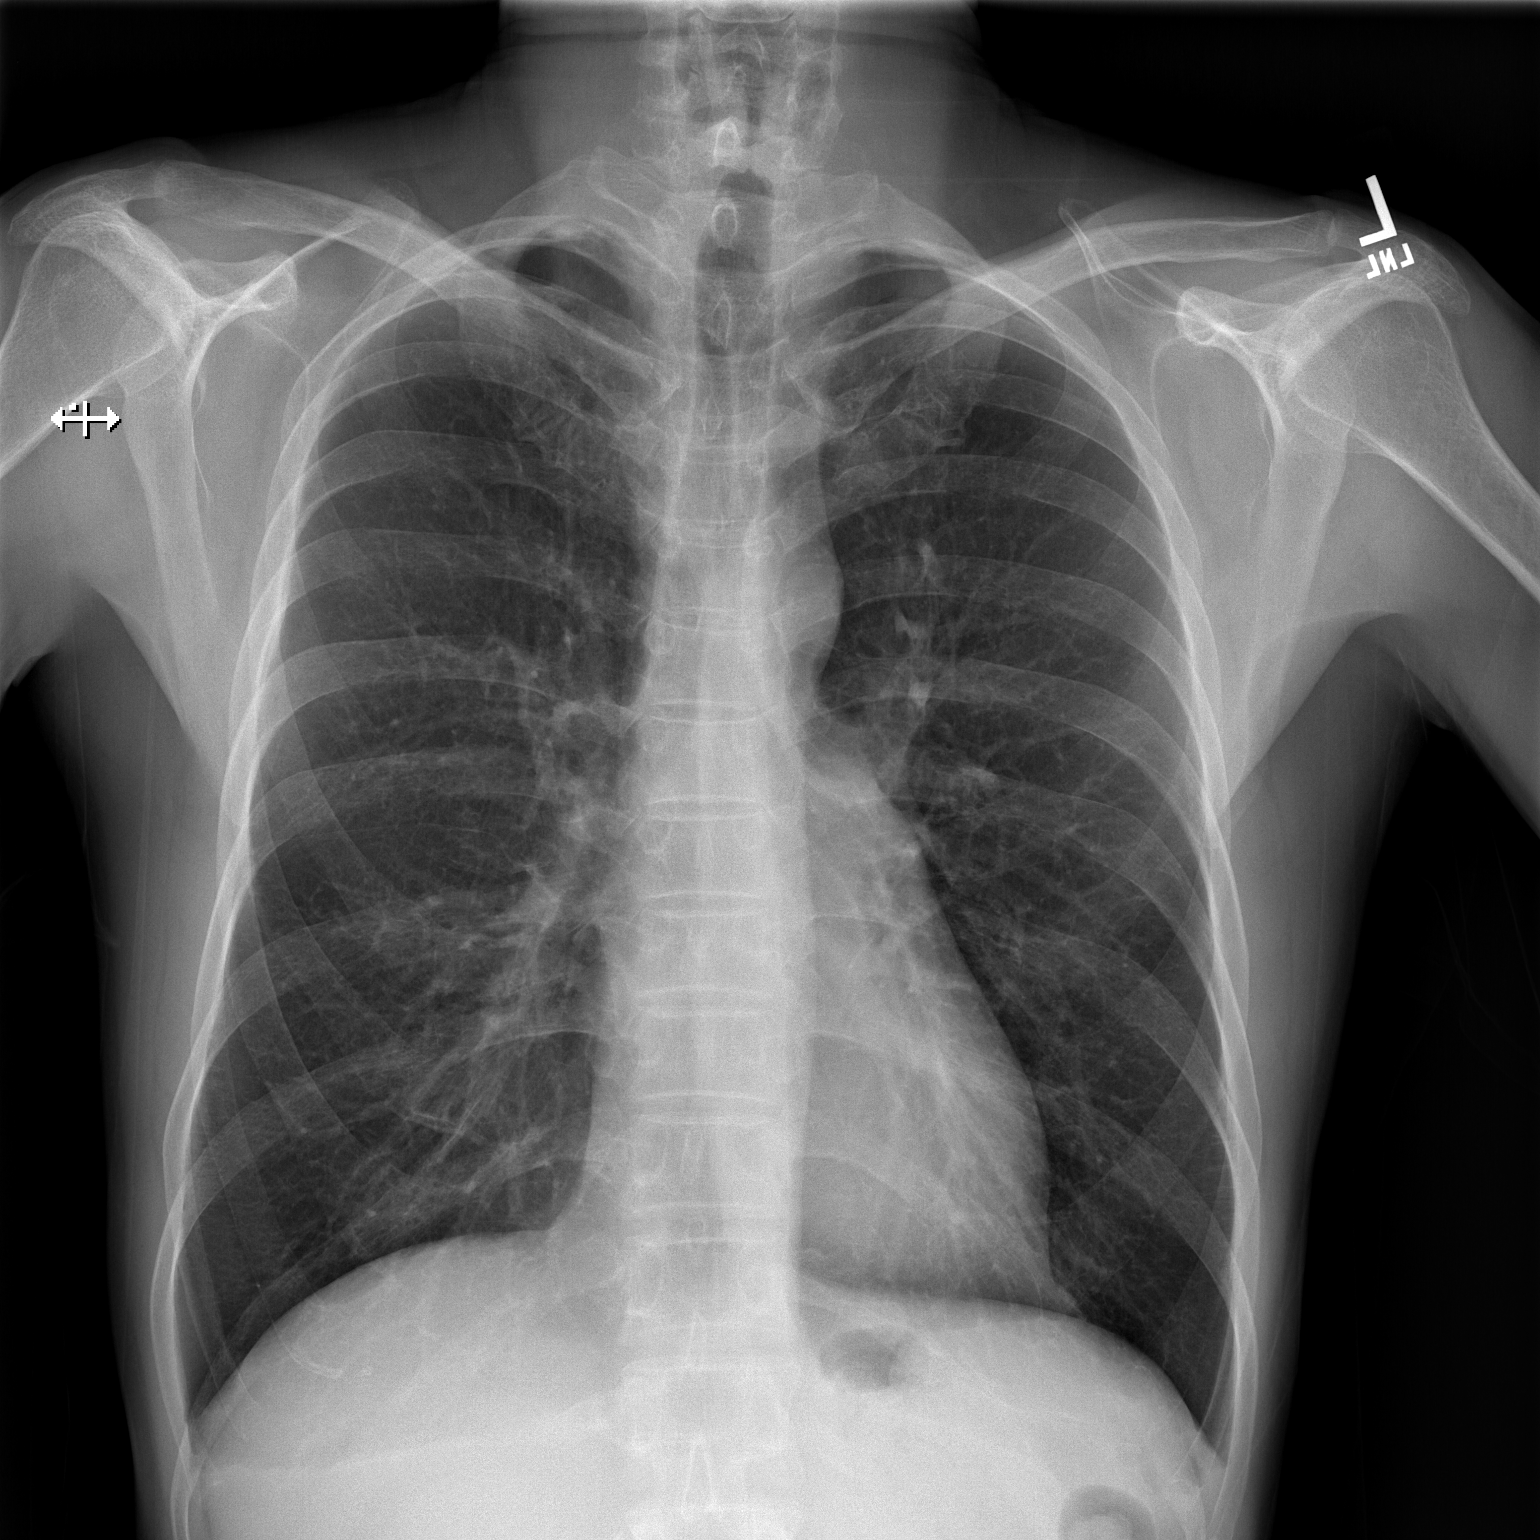

[w chest lat]
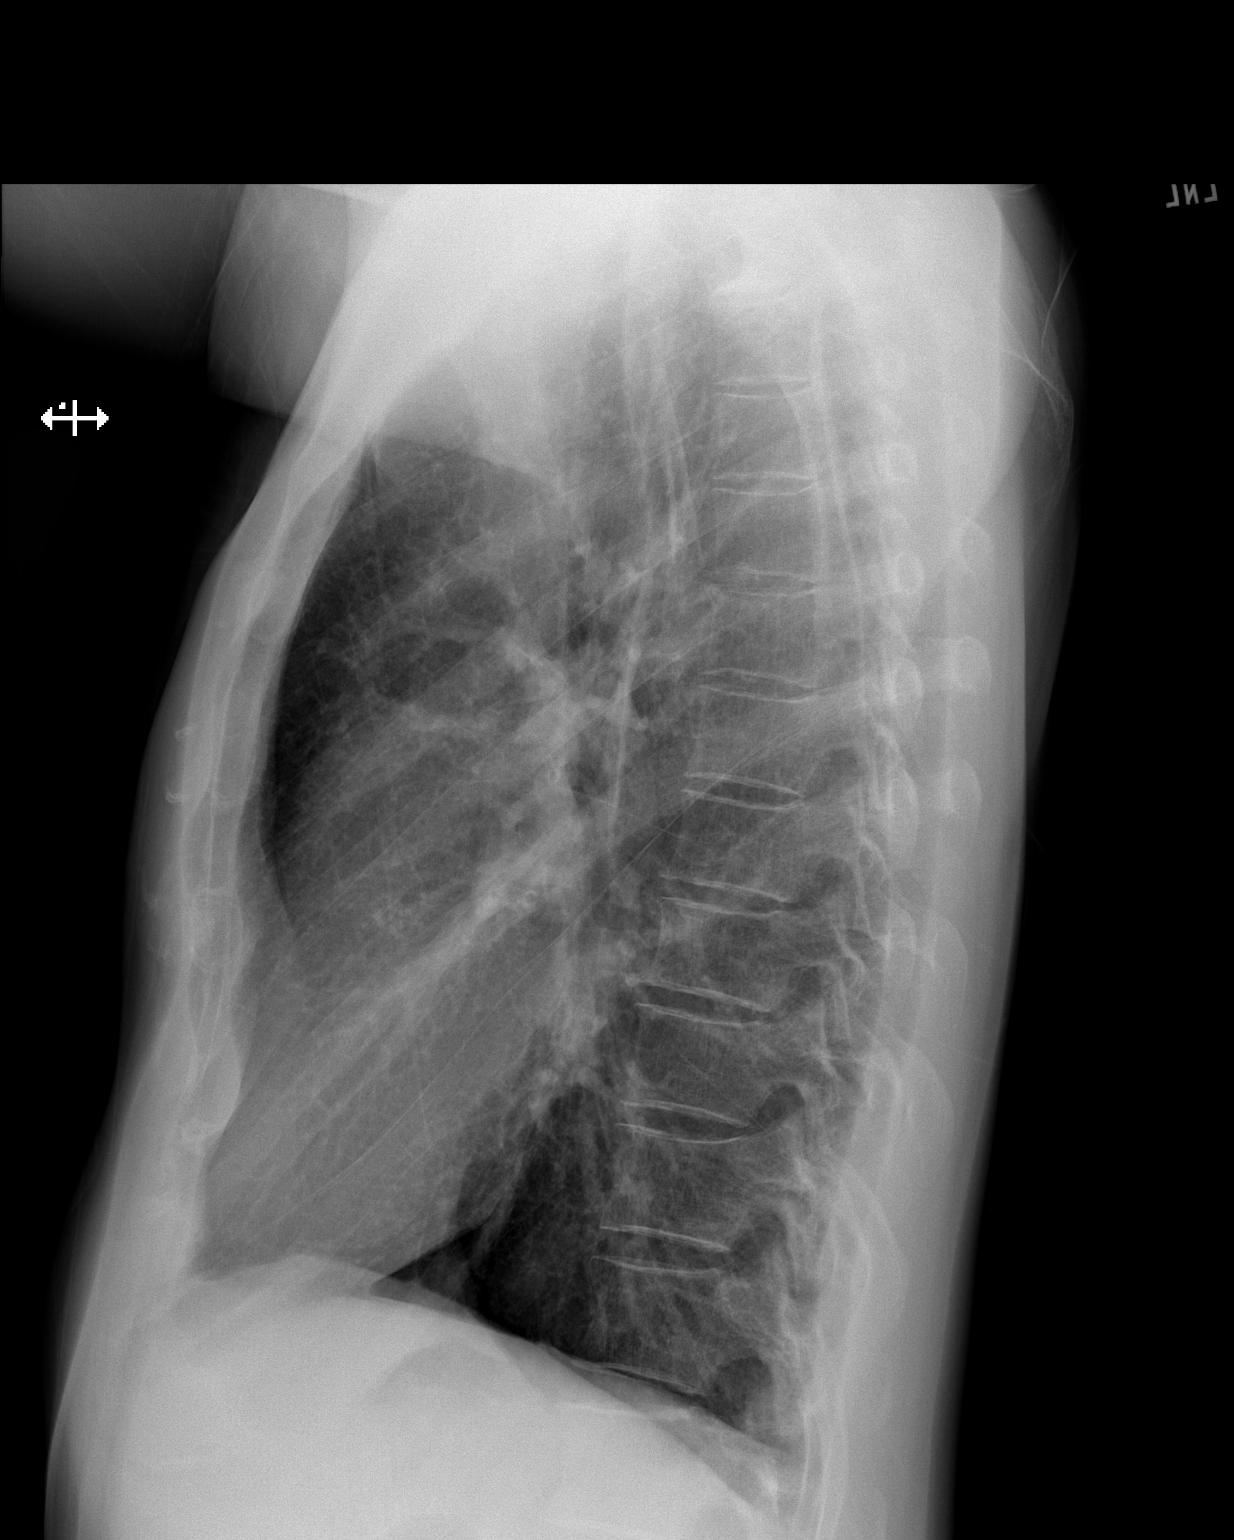

[2 of 2 positions shown; findings below may reference images not displayed]

FINDINGS: The heart size and mediastinal contours are within normal limits.
Normal pulmonary vascularity. The lungs remain hyperinflated. No
focal consolidation, pleural effusion, or pneumothorax. No acute
osseous abnormality.
IMPRESSION: 1.  No active cardiopulmonary disease.
2. COPD.
